# Patient Record
Sex: Male | Born: 1970 | Hispanic: No | Marital: Married | Smoking: Never smoker
Health system: Southern US, Community
[De-identification: ages and names within clinical notes are randomized; demographics above are authoritative.]

## PROBLEM LIST (undated history)

## (undated) ENCOUNTER — Emergency Department (HOSPITAL_BASED_OUTPATIENT_CLINIC_OR_DEPARTMENT_OTHER): Payer: BC Managed Care – PPO

## (undated) DIAGNOSIS — E785 Hyperlipidemia, unspecified: Secondary | ICD-10-CM

## (undated) DIAGNOSIS — Z8619 Personal history of other infectious and parasitic diseases: Secondary | ICD-10-CM

## (undated) HISTORY — DX: Hyperlipidemia, unspecified: E78.5

## (undated) HISTORY — DX: Personal history of other infectious and parasitic diseases: Z86.19

---

## 2004-12-31 ENCOUNTER — Ambulatory Visit: Payer: Self-pay | Admitting: Internal Medicine

## 2005-01-17 ENCOUNTER — Ambulatory Visit: Payer: Self-pay | Admitting: Internal Medicine

## 2005-05-20 ENCOUNTER — Ambulatory Visit: Payer: Self-pay | Admitting: Internal Medicine

## 2006-04-20 ENCOUNTER — Encounter: Payer: Self-pay | Admitting: Family Medicine

## 2006-04-20 ENCOUNTER — Ambulatory Visit: Payer: Self-pay | Admitting: Family Medicine

## 2006-04-20 DIAGNOSIS — E785 Hyperlipidemia, unspecified: Secondary | ICD-10-CM

## 2006-12-28 ENCOUNTER — Ambulatory Visit: Payer: Self-pay | Admitting: Family Medicine

## 2006-12-28 LAB — CONVERTED CEMR LAB
Albumin: 4.7 g/dL (ref 3.5–5.2)
Alkaline Phosphatase: 64 units/L (ref 39–117)
CO2: 27 meq/L (ref 19–32)
Calcium: 9.6 mg/dL (ref 8.4–10.5)
Chloride: 104 meq/L (ref 96–112)
Cholesterol: 195 mg/dL (ref 0–200)
Glucose, Bld: 88 mg/dL (ref 70–99)
LDL Cholesterol: 128 mg/dL — ABNORMAL HIGH (ref 0–99)
Potassium: 4.3 meq/L (ref 3.5–5.3)
Sodium: 139 meq/L (ref 135–145)
Total Protein: 7.3 g/dL (ref 6.0–8.3)
Triglycerides: 108 mg/dL (ref <150)

## 2006-12-29 ENCOUNTER — Encounter: Payer: Self-pay | Admitting: Family Medicine

## 2010-06-02 ENCOUNTER — Ambulatory Visit: Payer: Self-pay | Admitting: Family Medicine

## 2010-07-16 ENCOUNTER — Ambulatory Visit
Admission: RE | Admit: 2010-07-16 | Discharge: 2010-07-16 | Payer: Self-pay | Source: Home / Self Care | Attending: Family Medicine | Admitting: Family Medicine

## 2010-07-16 DIAGNOSIS — J029 Acute pharyngitis, unspecified: Secondary | ICD-10-CM | POA: Insufficient documentation

## 2010-07-17 ENCOUNTER — Encounter: Payer: Self-pay | Admitting: Family Medicine

## 2010-07-27 ENCOUNTER — Ambulatory Visit
Admission: RE | Admit: 2010-07-27 | Discharge: 2010-07-27 | Payer: Self-pay | Source: Home / Self Care | Attending: Family Medicine | Admitting: Family Medicine

## 2010-07-27 DIAGNOSIS — N10 Acute tubulo-interstitial nephritis: Secondary | ICD-10-CM | POA: Insufficient documentation

## 2010-07-27 LAB — CONVERTED CEMR LAB: Urobilinogen, UA: 0.2

## 2010-07-28 ENCOUNTER — Ambulatory Visit: Payer: Self-pay | Admitting: Family Medicine

## 2010-08-03 NOTE — Assessment & Plan Note (Signed)
Summary: FLU-SHOT  Nurse Visit   Allergies: No Known Drug Allergies  Immunizations Administered:  Influenza Vaccine # 1:    Vaccine Type: Fluvax 3+    Site: right deltoid    Mfr: GlaxoSmithKline    Dose: 0.5 ml    Route: IM    Given by: Sue Lush McCrimmon CMA, (AAMA)    Exp. Date: 12/03/2010    Lot #: [id number]    VIS given: 01/26/10 version given June 02, 2010.  Flu Vaccine Consent Questions:    Do you have a history of severe allergic reactions to this vaccine? no    Any prior history of allergic reactions to egg and/or gelatin? no    Do you have a sensitivity to the preservative Thimersol? no    Do you have a past history of Guillan-Barre Syndrome? no    Do you currently have an acute febrile illness? no    Have you ever had a severe reaction to latex? no    Vaccine information given and explained to patient? no  Orders Added: 1)  Flu Vaccine 37yrs + [90658] 2)  Admin 1st Vaccine [95638]

## 2010-08-04 ENCOUNTER — Ambulatory Visit (INDEPENDENT_AMBULATORY_CARE_PROVIDER_SITE_OTHER): Payer: BC Managed Care – PPO | Admitting: Family Medicine

## 2010-08-04 ENCOUNTER — Encounter: Payer: Self-pay | Admitting: Family Medicine

## 2010-08-04 DIAGNOSIS — N10 Acute tubulo-interstitial nephritis: Secondary | ICD-10-CM

## 2010-08-05 ENCOUNTER — Encounter: Payer: Self-pay | Admitting: Family Medicine

## 2010-08-05 NOTE — Assessment & Plan Note (Signed)
Summary: pyelonephritis   Vital Signs:  Patient profile:   40 year old [name] Height:      73 inches Weight:      186 pounds BMI:     24.63 O2 Sat:      98 % on Room air Temp:     99.6 degrees F oral Pulse rate:   91 / minute BP sitting:   127 / 79  (left arm) Cuff size:   regular  Vitals Entered By: Payton Spark CMA (July 27, 2010 8:59 AM)  O2 Flow:  Room air CC: ? Kidney stone x 3 days.    Primary Care Provider:  Seymour Bars DO  CC:  ? Kidney stone x 3 days. [name]  History of Present Illness: [name] presents for some abdominal pain that started 4 days ago.  On Sat, he woke up with bodyaches and chills.  That night, he had nightsweats.  On Sunday, he started having dysuria.  He has seen gross hematuria also.  He has had subjective fevers.  Has some nausea, no vomitting.  He is drinking a lot of water.  He has also had some flank pain and urgency.  He had a kidney stone at age 43 but none since then.  He denies any colicky pain.      Allergies: No Known Drug Allergies  Past History:  Past Medical History: Reviewed history from 04/20/2006 and no changes required. Hyperlipidemia  Social History: Reviewed history from 07/16/2010 and no changes required. 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   Eyes:  sclera non icteric Mouth:  pharynx pink and moist.  o/p mildly injected Neck:  no masses.   Lungs:  Normal respiratory effort, chest expands symmetrically. Lungs are clear to auscultation, no crackles or wheezes. Heart:  Normal rate and regular rhythm. S1 and S2 normal without gallop, murmur, click, rub or other extra sounds. Abdomen:  +suprapubic tenderness with R>L CVAT.  ND.  NABS.   Extremities:  no LE edema Skin:  diaphoretic Cervical Nodes:  No lymphadenopathy noted Psych:   good eye contact, not anxious appearing, and not depressed appearing.     Impression & Recommendations:  Problem # 1:  PYELONEPHRITIS, ACUTE (ICD-590.10)  UA grossly + for infection with symptoms c/w Pyelo.  Will treat with Bactrim DS x 7 days, Ibuprofen alternating with Tramadol for pain.  Hydrate with water and rest.  Call if fevers persist past 48 hrs, pain worsens or vomitting occurs.  Repeat UA with cx in 7-8 days.  Orders: UA Dipstick w/o Micro (automated)  (81003)  Complete Medication List: 1)  Nasonex 50 Mcg/act Susp (Mometasone furoate) .... 2 sprays/ nostril once daily 2)  Tramadol Hcl 50 Mg Tabs (Tramadol hcl) .Travis [name].. 1 tab by mouth q 8 hrs as needed pain 3)  Sulfamethoxazole-tmp Ds 800-160 Mg Tabs (Sulfamethoxazole-trimethoprim) .Travis [name].. 1 tab by mouth two times a day x 7 days  Patient Instructions: 1)  For kidney infection: 2)  Take Bactrim DS 1 tab with breakfast and dinner. 3)  Alternate Ibuprofen 800 mg three times a day with Tramadol up to 3 x a day for pain. 4)  Hydrate, rest. 5)  Call if any new symptoms develop. 6)  REturn for a repeat UA with culture in 7-8 days. Prescriptions: SULFAMETHOXAZOLE-TMP DS 800-160 MG  TABS (SULFAMETHOXAZOLE-TRIMETHOPRIM) 1 tab by mouth two times a day x 7 days  #14 x 0   Entered and Authorized by:   Seymour Bars DO   Signed by:   Seymour Bars DO on 07/27/2010   Method used:   Electronically to        [name] (retail)       [name]       [place], [place]  [address]       Ph: [phone]       Fax: [phone]   RxID:   [id number] TRAMADOL HCL 50 MG TABS (TRAMADOL HCL) 1 tab by mouth q 8 hrs as needed pain  #24 x 0   Entered and Authorized by:   Seymour Bars DO   Signed by:   Seymour Bars DO on 07/27/2010   Method used:   Electronically to        [name] (retail)       [name]       [place], [place]  [address]       Ph:  [phone]       Fax: [phone]   RxID:   [id number]    Orders Added: 1)  UA Dipstick w/o Micro (automated)  [81003] 2)  Est. Patient Level III [88416]     Laboratory Results   Urine Tests    Routine Urinalysis   Color: brown Appearance: Hazy Glucose: negative   (Normal Range: Negative) Bilirubin: small   (Normal Range: Negative) Ketone: moderate (40)   (Normal Range: Negative) Spec. Gravity: >=1.030   (Normal Range: 1.003-1.035) Blood: large   (Normal Range: Negative) pH: 6.5   (Normal Range: 5.0-8.0) Protein: >=300   (Normal Range: Negative) Urobilinogen: 0.2   (Normal Range: 0-1) Nitrite: positive   (Normal Range: Negative) Leukocyte Esterace: small   (Normal Range: Negative)

## 2010-08-05 NOTE — Assessment & Plan Note (Signed)
Summary: sore throat   Vital Signs:  Patient profile:   40 year old male Height:      73 inches Weight:      190 pounds BMI:     25.16 O2 Sat:      99 % on Room air Temp:     98.3 degrees F oral Pulse rate:   85 / minute BP sitting:   123 / 72  (left arm) Cuff size:   regular  Vitals Entered By: Payton Spark CMA (July 16, 2010 1:47 PM)  O2 Flow:  Room air CC: ST x 3 weeks.   Primary Care Provider:  Seymour Bars DO  CC:  ST x 3 weeks.Marland Kitchen  History of Present Illness: [name] presents for a sore throat that began before Christmas (about 3 wks ago).  He has not had much cough or congestion.  No fevers or chills.  Denies bodyaches or headaches.  Throat pain seems to be worse later in the days.  When the pain was bad, he was having odynophagia.  Denies heartburn symptoms.  Has some hoarsenss in the evening.    He is not a smoker or a drinker.  Has some postnasal dirp.  His kids have had strep in the past 2 wks.      Current Medications (verified): 1)  None  Allergies (verified): No Known Drug Allergies  Past History:  Past Medical History: Reviewed history from 04/20/2006 and no changes required. Hyperlipidemia  Social History: Reviewed history from 04/20/2006 and no changes required. 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   Head:  normocephalic and atraumatic.  sinuses NTTP Eyes:  conjunctiva clear; sclera non icteric Nose:  no nasal discharge.   Mouth:  good dentition and pharynx pink and moist.  o/p injected with cobblestoning. Neck:  no masses.   Lungs:  Normal respiratory effort, chest expands symmetrically. Lungs are clear to auscultation, no crackles or wheezes. Heart:  Normal rate and regular rhythm. S1 and S2 normal without gallop, murmur, click, rub or other extra  sounds. Abdomen:  no epigastric TTP Skin:  color normal.   Cervical Nodes:  No lymphadenopathy noted   Impression & Recommendations:  Problem # 1:  SORE THROAT (ICD-462) Rapid strep neg.  Sore throat x 3 wks.  Throat cx done.  F/U results. DDX includes: postnasal drip, acid reflux.  Will start him on Nasonex daily + supportive care measures.  If throat pain has not improved and his throat cx is negative, will send him to ENT to look for signs of reflux with directed laryngoscopy. Orders: Rapid Strep (70350) T-Culture, Throat ([id number])  Complete Medication List: 1)  Nasonex 50 Mcg/act Susp (Mometasone furoate) .... 2 sprays/ nostril once daily  Patient Instructions: 1)  Throat Culture today. 2)  Will call you w/ results by Monday. 3)  Rapid Strep: negative. 4)  Start Nasonex 2 sprays/ nostril daily for postnasal drip. 5)  Use Chlorasceptic spray with honey lemon tea. 6)  If throat culture is negative and your throat pain has not resolved after 10 days of Nasonex, please call and will get you in with ENT. Prescriptions: NASONEX 50 MCG/ACT SUSP (MOMETASONE FUROATE) 2 sprays/ nostril once daily  #1 bottle x 0   Entered and Authorized by:   Seymour Bars DO  Signed by:   Seymour Bars DO on 07/16/2010   Method used:   Electronically to        [facility] [address] (retail)       [address] [place], [place]  [address]       Ph: [phone]       Fax: [phone]   RxID:   [id number]    Orders Added: 1)  Rapid Strep [56387] 2)  T-Culture, Throat [[id number]] 3)  Est. Patient Level III [84166]    Laboratory Results    Other Tests  Rapid Strep: negative

## 2010-08-11 NOTE — Assessment & Plan Note (Signed)
Summary: f/u pyelo   Vital Signs:  Patient profile:   40 year old male Height:      73 inches Weight:      188 pounds Pulse rate:   78 / minute BP sitting:   122 / 78  (right arm) Cuff size:   regular  Vitals Entered By: Avon Gully CMA, Duncan Dull) (August 04, 2010 8:21 AM) CC: f/u pyelo   Primary Care Provider:  Seymour Bars DO  CC:  f/u pyelo.  History of Present Illness: [name] presents for f/u pylenephritis.  He just completed 7 days of Bactrim DS yesterday.  No longer having voiding complains or fevers or nightsweats.  He did have to take Motrin for the first 3 days of treatment.  His appetite and energy level are back to normal.    Allergies: No Known Drug Allergies  Past History:  Past Medical History: Reviewed history from 04/20/2006 and no changes required. Hyperlipidemia  Social History: Reviewed history from 07/16/2010 and no changes required. 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   Head:  normocephalic and atraumatic.   Mouth:  pharynx pink and moist.   Lungs:  Normal respiratory effort, chest expands symmetrically. Lungs are clear to auscultation, no crackles or wheezes. Heart:  Normal rate and regular rhythm. S1 and S2 normal without gallop, murmur, click, rub or other extra sounds. Abdomen:  no CVAT or suprapubic pressure Extremities:  no LE edema Skin:  color normal.   Psych:  good eye contact, not anxious appearing, and not depressed appearing.     Impression & Recommendations:  Problem # 1:  PYELONEPHRITIS, ACUTE (ICD-590.10) Assessment Improved Clinically, he is much improved and is UA looks to be much improved.  Will culture urine today to be sure infection has cleared (and there is no prostatitis).  F/U results in the next 48 hrs.    Orders: T-Culture, Urine ([id number])  Complete Medication List: 1)  Nasonex 50 Mcg/act Susp (Mometasone furoate) .... 2 sprays/ nostril once daily 2)  Sulfamethoxazole-tmp Ds 800-160 Mg Tabs (Sulfamethoxazole-trimethoprim) .Marland Kitchen.. 1 tab by mouth two times a day x 7 days  Patient Instructions: 1)  Will culture urine to be back either Fri or Monday. 2)  Call me if any further problems. 3)  Return this summer for PHYSICAL with fasting labs.   Orders Added: 1)  T-Culture, Urine [[id number]] 2)  Est. Patient Level III [30865]    Laboratory Results   Urine Tests  Date/Time Received: 08/04/10 Date/Time Reported: 08/04/10        Appended Document: f/u pyelo     Vitals Entered By: Payton Spark CMA (August 04, 2010 8:39 AM)  Allergies: No Known Drug Allergies   Complete Medication List: 1)  Nasonex 50 Mcg/act Susp (Mometasone furoate) .... 2 sprays/ nostril once daily 2)  Sulfamethoxazole-tmp Ds 800-160 Mg Tabs (Sulfamethoxazole-trimethoprim) .Marland Kitchen.. 1 tab by mouth two times a day x 7 days  Other Orders: UA Dipstick w/o Micro (automated)  (81003)   Orders Added: 1)  UA Dipstick w/o Micro (automated)  [81003]     Laboratory Results   Urine Tests    Routine Urinalysis   Color: yellow Appearance: Clear Glucose: negative   (Normal Range: Negative) Bilirubin: negative   (Normal Range: Negative) Ketone: negative   (Normal Range: Negative)  Spec. Gravity: 1.025   (Normal Range: 1.003-1.035) Blood: trace-intact   (Normal Range: Negative) pH: 6.0   (Normal Range: 5.0-8.0) Protein: negative   (Normal Range: Negative) Urobilinogen: 0.2   (Normal Range: 0-1) Nitrite: negative   (Normal Range: Negative) Leukocyte Esterace: trace   (Normal Range: Negative)

## 2010-11-04 ENCOUNTER — Ambulatory Visit (INDEPENDENT_AMBULATORY_CARE_PROVIDER_SITE_OTHER): Payer: BC Managed Care – PPO | Admitting: Family Medicine

## 2010-11-04 ENCOUNTER — Encounter: Payer: Self-pay | Admitting: Family Medicine

## 2010-11-04 DIAGNOSIS — Z0000 Encounter for general adult medical examination without abnormal findings: Secondary | ICD-10-CM

## 2010-11-04 DIAGNOSIS — Z130 Encounter for screening for diseases of the blood and blood-forming organs and certain disorders involving the immune mechanism: Secondary | ICD-10-CM

## 2010-11-04 DIAGNOSIS — R1012 Left upper quadrant pain: Secondary | ICD-10-CM

## 2010-11-04 DIAGNOSIS — Z13220 Encounter for screening for lipoid disorders: Secondary | ICD-10-CM

## 2010-11-04 DIAGNOSIS — I7389 Other specified peripheral vascular diseases: Secondary | ICD-10-CM

## 2010-11-04 LAB — POCT URINALYSIS DIPSTICK
Blood, UA: NEGATIVE
Leukocytes, UA: NEGATIVE
Nitrite, UA: NEGATIVE
Protein, UA: NEGATIVE
pH, UA: 7.5

## 2010-11-04 MED ORDER — TAMSULOSIN HCL 0.4 MG PO CAPS: 0.4000 mg | ORAL_CAPSULE | ORAL | Status: DC

## 2010-11-04 NOTE — Progress Notes (Signed)
  Subjective:    Patient ID: Travis [name], male    DOB: [birth year], 40 y.o.   MRN: [id number]  HPI  [name] presents for CPE.  He has had some urinary leakage after voiding x 4 wks.  He also has some abdominal pain in the LUQ with turning to the side.  It is a little twinge.  Started after he had a kidney infection last year.  He denies fam hx of premature heart dz, fam hx of colon or prostate cancer.  He has coldness and blueish tinge to his fingertips and toes and the tip of his nose for several mos.  Not change in Wt, GI symptoms, HAs, weakness or joint pains or swelling.  He is due for fsating labs.  Tetanus updated in 2007.    BP 120/83  Pulse 58  Ht 6\' 1"  (1.854 m)  Wt 195 lb (88.451 kg)  BMI 25.73 kg/m2  SpO2 100%      Review of Systems Gen: no fevers, chills, hot flashes, night sweats, change in weight GI: no N/V/C/D GU: no dysuria, incontinence or sexual dysfunction CV: no chest pain, DOE, palpitations s or edema Pulm:  Denies CP, SOB or chronic cough     Objective:   Physical Exam     Gen: alert, well groomed in NAD Neck: no thyromegaly or cervical lymphadenopathy CV: RRR w/o murmur, no audible carotid bruits or abdominal aortic bruits Ext: no edema, clubbing, acrocyanosis all fingertips and toes with > 2 sec cap RF.   Lungs: CTA bilat w/o W/R/R; nonlabored HEENT:  [place]/AT; PERRLA; oropharynx pink and moist with good dentition Abd: soft, + LUQ/ flank slightly tender to palpation, ND, NABS, No HSM, no audible AA bruits Skin: warm and dry; no rash, pallor or jaundice Psych: does not appear anxious or depressed; answers questions appropriately   Assessment & Plan:  Assesment:  1. CPE- Keeping healthy checklist for men reviewed today.  BP at goal.  BMI 25  in the  normal range.     Labs ordered Colonoscopy due at 48. Encouraged healthy diet, regular exercise, MVI daily. Return for next physical in 1 yr.   Immunizations UTD. Will f/u L flank pain/ acrocyanosis. UA  normal.  AUA score 6.  Trial of Flomax at bedtime.  RTC for f/u in 6 wks.

## 2010-11-04 NOTE — Patient Instructions (Addendum)
UA today. AUA symptom score today.  Labs today. Will call you w/ results and plan of care.  Start Flomax at bedtime.  This should help bladder to empty better if any prostate enlargement.  Return for f/u visit in 6 wks.

## 2010-11-05 ENCOUNTER — Telehealth: Payer: Self-pay | Admitting: Family Medicine

## 2010-11-05 DIAGNOSIS — I7389 Other specified peripheral vascular diseases: Secondary | ICD-10-CM

## 2010-11-05 DIAGNOSIS — R1012 Left upper quadrant pain: Secondary | ICD-10-CM

## 2010-11-05 LAB — SEDIMENTATION RATE: Sed Rate: 1 mm/hr (ref 0–16)

## 2010-11-05 LAB — CBC WITH DIFFERENTIAL/PLATELET
Eosinophils Relative: 0 % (ref 0–5)
Lymphocytes Relative: 36 % (ref 12–46)
Lymphs Abs: 1.2 10*3/uL (ref 0.7–4.0)
MCV: 96.8 fL (ref 78.0–100.0)
Neutrophils Relative %: 57 % (ref 43–77)
Platelets: 202 10*3/uL (ref 150–400)
RBC: 4.64 MIL/uL (ref 4.22–5.81)
WBC: 3.5 10*3/uL — ABNORMAL LOW (ref 4.0–10.5)

## 2010-11-05 LAB — ANA: Anti Nuclear Antibody(ANA): NEGATIVE

## 2010-11-05 LAB — COMPLETE METABOLIC PANEL WITH GFR
ALT: 43 U/L (ref 0–53)
Albumin: 5.1 g/dL (ref 3.5–5.2)
CO2: 27 mEq/L (ref 19–32)
Calcium: 9.5 mg/dL (ref 8.4–10.5)
Chloride: 102 mEq/L (ref 96–112)
GFR, Est African American: >60 mL/min (ref >60)
Potassium: 4.8 mEq/L (ref 3.5–5.3)
Sodium: 139 mEq/L (ref 135–145)
Total Protein: 7.7 g/dL (ref 6.0–8.3)

## 2010-11-05 LAB — LIPID PANEL: LDL Cholesterol: 184 mg/dL — ABNORMAL HIGH (ref 0–99)

## 2010-11-05 NOTE — Telephone Encounter (Signed)
LMOM for Pt to CB 

## 2010-11-05 NOTE — Telephone Encounter (Signed)
Pt aware of the above  

## 2010-11-05 NOTE — Telephone Encounter (Signed)
Pls let pt know that his blood counts came back normal other than a mildly low WBC count.  His fasting sugar is 103, into the prediabetic range with an elevated cholesterol  Of 265 and an LDL bad cholesterol of 184.  His inflammatory marker is normal.  ANA screen for lupus is neg.  I am going to proceed with a CT abdomen and pelvis to look at his kidneys.  I am going to get him into rheumatology to r/o Raynaud's Dz (blueish fingers and toes on exam).

## 2010-11-08 ENCOUNTER — Telehealth: Payer: Self-pay | Admitting: *Deleted

## 2010-11-08 NOTE — Telephone Encounter (Signed)
[facility] called stating Pt should only have CT w/ contrast per Dr. Allyson Sabal. If you agree please order and I will fax.

## 2010-11-09 NOTE — Telephone Encounter (Signed)
Yes, I agree 

## 2010-11-10 ENCOUNTER — Telehealth: Payer: Self-pay | Admitting: *Deleted

## 2010-11-10 DIAGNOSIS — R1031 Right lower quadrant pain: Secondary | ICD-10-CM

## 2010-11-10 NOTE — Telephone Encounter (Signed)
Pt aware of the above  

## 2010-11-10 NOTE — Telephone Encounter (Signed)
Pt states he is feeling some very minor SEs from flomax but doesn't feel that that it is helping w/ urinary problem. Pt would like to know if he should continue to take. Please advise.

## 2010-11-10 NOTE — Telephone Encounter (Signed)
Stop the Flomax.  I do need to f/u the results of his upcoming CT. Will go ahead and set him up with urology.

## 2010-11-16 ENCOUNTER — Ambulatory Visit
Admission: RE | Admit: 2010-11-16 | Discharge: 2010-11-16 | Disposition: A | Discharge status: Home / Self Care | Payer: BC Managed Care – PPO | Source: Ambulatory Visit | Attending: Family Medicine | Admitting: Family Medicine

## 2010-11-16 DIAGNOSIS — R1031 Right lower quadrant pain: Secondary | ICD-10-CM

## 2010-11-16 MED ORDER — IOHEXOL 300 MG/ML  SOLN
100.0000 mL | Freq: Once | INTRAMUSCULAR | Status: AC | PRN
  Administered 2010-11-16: 100 mL via INTRAVENOUS

## 2010-11-19 ENCOUNTER — Telehealth: Payer: Self-pay | Admitting: Family Medicine

## 2010-11-19 NOTE — Telephone Encounter (Signed)
Pt notified with his abdominal and kidney CT results.  He was told negative for this study. Jarvis Newcomer, LPN [facility]

## 2010-11-22 ENCOUNTER — Telehealth: Payer: Self-pay | Admitting: Family Medicine

## 2010-11-22 ENCOUNTER — Encounter: Payer: Self-pay | Admitting: Family Medicine

## 2010-11-22 ENCOUNTER — Ambulatory Visit (INDEPENDENT_AMBULATORY_CARE_PROVIDER_SITE_OTHER): Payer: BC Managed Care – PPO | Admitting: Family Medicine

## 2010-11-22 DIAGNOSIS — R32 Unspecified urinary incontinence: Secondary | ICD-10-CM | POA: Insufficient documentation

## 2010-11-22 DIAGNOSIS — M5431 Sciatica, right side: Secondary | ICD-10-CM | POA: Insufficient documentation

## 2010-11-22 DIAGNOSIS — M5430 Sciatica, unspecified side: Secondary | ICD-10-CM

## 2010-11-22 MED ORDER — METHYLPREDNISOLONE (PAK) 4 MG PO TABS: 4.0000 mg | ORAL_TABLET | Freq: Every day | ORAL | Status: DC

## 2010-11-22 MED ORDER — CYCLOBENZAPRINE HCL 10 MG PO TABS: 10.0000 mg | ORAL_TABLET | Freq: Every evening | ORAL | Status: DC | PRN

## 2010-11-22 NOTE — Assessment & Plan Note (Signed)
Very mild.  His CT was normal and he continues to have symptoms.  Tried flomax for BPH but this did not help.  Will proceed with urologyr eferal.

## 2010-11-22 NOTE — Progress Notes (Signed)
  Subjective:    Patient ID: [place] [name], male    DOB: 1971/06/18, 40 y.o.   MRN: [id number]  HPI  [name] presents for a Low back injury that occurred 1 wk ago.  He had arms outstretched tried to lift his 45 lb son above his head and he felt immediate pain in the lower back, shoots down the R leg.  Hurts the most getting out of a chair.  Feels better with standing.  Sleeping flat on back - ok.  Ibuprofen helps a little bit.  No hx of back problems.  Denies change to bowel or bladder habits (has had some leakage for months-- waiting to see urologist).  Just recently had a normal CT scan of his abdomen/ pelvis.  Has some tingling in to the R leg that does not extend past the knee.  No hx of back problems.  BP 125/88  Pulse 72  Resp 20  Ht 5\' 11"  (1.803 m)  Wt 193 lb (87.544 kg)  BMI 26.92 kg/m2  SpO2 100%     Review of Systems  Genitourinary: Negative for difficulty urinating.  Musculoskeletal: Positive for myalgias, back pain and gait problem.  Neurological: Positive for weakness. Negative for tremors.       Objective:   Physical Exam  Constitutional: He appears well-developed and well-nourished. No distress.  Cardiovascular: Normal rate, regular rhythm and normal heart sounds.   Pulmonary/Chest: Effort normal and breath sounds normal.  Musculoskeletal:       Lumbar back: He exhibits decreased range of motion (very limited iwth L spine flex/ ext), tenderness (tender L>R at L5-S1 and L sciatic notch) and spasm. He exhibits no edema.          Assessment & Plan:

## 2010-11-22 NOTE — Patient Instructions (Signed)
Take Medrol dose Pack in the morning (hold other anti-inflammatories like ibuprofen) while you are on this. Use Flexeril at night as muscle relaxer.   Avoid heavy lifting. Call if not resolved in 2 wks.     Lumbar Radiculopathy, Sciatica Sciatica is a weakness and/or changes in sensation (tingling, jolts, hot and cold, numbness) along the path the sciatic nerve travels. Irritation or damage to lumbar nerve roots is often also referred to as lumbar radiculopathy.  Lumbar radiculopathy (Sciatica) is the most common form of this problem. Radiculopathy can occur in any of the nerves coming out of the spinal cord. The problems caused depend on which nerves are involved. The sciatic nerve is the large nerve supplying the branches of nerves going from the hip to the toes. It often causes a numbness or weakness in the skin and/or muscles that the sciatic nerve serves. It also may cause symptoms (problems) of pain, burning, tingling, or electric shock-like feelings in the path of this nerve. This usually comes from injury to the fibers that make up the sciatic nerve. Some of these symptoms are low back pain and/or unpleasant feelings in the following areas:  From the mid-buttock down the back of the leg to the back of the knee.   And/or the outside of the calf and top of the foot.   And/or behind the inner ankle to the sole of the foot.  CAUSES  Herniated or slipped disc. Discs are the little cushions between the bones in the back.   Pressure by the piriformis muscle in the buttock on the sciatic nerve (Piriformis Syndrome).   Misalignment of the bones in the lower back and buttocks (Sacroiliac Joint Derangement).   Narrowing of the spinal canal that puts pressure on or pinches the fibers that make up the sciatic nerve.   A slipped vertebra that is out of line with those above or beneath it.   Abnormality of the nervous system itself so that nerve fibers do not transmit signals properly,  especially to feet and calves (neuropathy).   Tumor (this is rare).  Your caregiver can usually determine the cause of your sciatica and begin the treatment most likely to help you. TREATMENT Taking over-the-counter painkillers, physical therapy, rest, exercise, spinal manipulation, and injections of anesthetics and/or steroids may be used. Surgery, acupuncture, and Yoga can also be effective. Mind over matter techniques, mental imagery, and changing factors such as your bed, chair, desk height, posture, and activities are other treatments that may be helpful. You and your caregiver can help determine what is best for you. With proper diagnosis, the cause of most sciatica can be identified and removed. Communication and cooperation between your caregiver and you is essential. If you are not successful immediately, do not be discouraged. With time, a proper treatment can be found that will make you comfortable. HOME CARE INSTRUCTIONS  If the pain is coming from a problem in the back, applying ice to that area for 15 minutes, 2-3 times per day while awake, may be helpful. Put the ice in a plastic bag. Place a towel between the bag of ice and your skin.   You may exercise or perform your usual activities if these do not aggravate your pain, or as suggested by your caregiver.   Only take over-the-counter or prescription medicines for pain, discomfort, or fever as directed by your caregiver.   If your caregiver has given you a follow-up appointment, it is very important to keep that appointment. Not keeping  the appointment could result in a chronic or permanent injury, pain, and disability. If there is any problem keeping the appointment, you must call back to this facility for assistance.  SEEK IMMEDIATE MEDICAL CARE IF:  You experience loss of control of bowel or bladder.   You have increasing weakness in the trunk, buttocks, or legs.   There is numbness in any areas from the hip down to the toes.     You have difficulty walking or keeping your balance.   You have any of the above, with fever or forceful vomiting.  Document Released: 06/14/2001 Document Re-Released: 07/12/2009 [facility] Patient Information 2011 [place], [place].

## 2010-11-22 NOTE — Assessment & Plan Note (Signed)
Acute sciatica from lifting injury 1 wk ago with no hx of back problems.  Will trade his OTC ibuprofen for a medrol dose pack and use flexeril at night.  Heat/ ice and relative rest may also help.  Call if not improving in 10-14 days or if new symptoms develop.

## 2010-12-02 NOTE — Telephone Encounter (Signed)
Closed encounter. Johnnell Liou, LPN /Triage  

## 2010-12-05 ENCOUNTER — Encounter: Payer: Self-pay | Admitting: Family Medicine

## 2011-01-20 ENCOUNTER — Encounter: Payer: Self-pay | Admitting: Family Medicine

## 2011-01-20 ENCOUNTER — Ambulatory Visit (INDEPENDENT_AMBULATORY_CARE_PROVIDER_SITE_OTHER): Payer: BC Managed Care – PPO | Admitting: Family Medicine

## 2011-01-20 DIAGNOSIS — R32 Unspecified urinary incontinence: Secondary | ICD-10-CM

## 2011-01-20 DIAGNOSIS — R7309 Other abnormal glucose: Secondary | ICD-10-CM

## 2011-01-20 DIAGNOSIS — D72819 Decreased white blood cell count, unspecified: Secondary | ICD-10-CM

## 2011-01-20 DIAGNOSIS — M5431 Sciatica, right side: Secondary | ICD-10-CM

## 2011-01-20 DIAGNOSIS — E785 Hyperlipidemia, unspecified: Secondary | ICD-10-CM

## 2011-01-20 DIAGNOSIS — M5430 Sciatica, unspecified side: Secondary | ICD-10-CM

## 2011-01-20 NOTE — Patient Instructions (Signed)
Repeat labs, 8 hrs fasting one morning downstairs (M-F 8-5).  Will call you w/ results.    Work on healthy low sugar/ low carb diet. Can add Omega 3 Fish Oil 2-4 grams/ day.

## 2011-01-20 NOTE — Assessment & Plan Note (Signed)
Improved.  Seeing urology.  Did a trial of flomax but the SEs outweighed the benefit so he stopped.  He does have f/u with them.

## 2011-01-20 NOTE — Assessment & Plan Note (Signed)
Resolved

## 2011-01-20 NOTE — Progress Notes (Signed)
  Subjective:    Patient ID: Travis [name], male    DOB: Jul 22, 1970, 40 y.o.   MRN: [id number]  HPI [name] presents for f/u visit.  He did see urology and was tried on Flomax but thought it didn't work and the SEs were not worth it.  He is feeling great.  Back to regular exercise.  Back and sciatric leg pain have much improved.    I wanted to recheck his labs.  In may, his gluocse fasting was 103, high cholesterol was high and his WBC was a little low.    BP 115/79  Pulse 58  Ht 6\' 1"  (1.854 m)  Wt 191 lb (86.637 kg)  BMI 25.20 kg/m2    Review of Systems  Constitutional: Negative for fatigue and unexpected weight change.  Respiratory: Negative for shortness of breath.   Cardiovascular: Negative for chest pain and palpitations.  Genitourinary: Negative for dysuria and difficulty urinating.  Musculoskeletal: Negative for back pain.       Objective:   Physical Exam  Constitutional: He appears well-developed and well-nourished.  HENT:  Mouth/Throat: Oropharynx is clear and moist.  Neck: No thyromegaly present.  Cardiovascular: Normal rate and normal heart sounds.   No murmur heard. Pulmonary/Chest: Effort normal and breath sounds normal.  Musculoskeletal: He exhibits no edema.          Assessment & Plan:

## 2011-01-20 NOTE — Assessment & Plan Note (Signed)
Not on meds.  + fam hx of high chol.  Recheck FLP along iwt fasting sugar and WBC (low in may).  F/u results.

## 2011-06-21 ENCOUNTER — Encounter: Payer: Self-pay | Admitting: Family Medicine

## 2011-06-21 ENCOUNTER — Ambulatory Visit (INDEPENDENT_AMBULATORY_CARE_PROVIDER_SITE_OTHER): Payer: BC Managed Care – PPO | Admitting: Family Medicine

## 2011-06-21 ENCOUNTER — Ambulatory Visit: Payer: BC Managed Care – PPO | Admitting: Family Medicine

## 2011-06-21 VITALS — BP 105/68 | HR 63 | Temp 97.4°F | Ht 72.0 in | Wt 181.0 lb

## 2011-06-21 DIAGNOSIS — J209 Acute bronchitis, unspecified: Secondary | ICD-10-CM

## 2011-06-21 DIAGNOSIS — J019 Acute sinusitis, unspecified: Secondary | ICD-10-CM

## 2011-06-21 MED ORDER — FLUTICASONE PROPIONATE 50 MCG/ACT NA SUSP: 2.0000 | Freq: Every day | NASAL | Status: DC

## 2011-06-21 MED ORDER — FEXOFENADINE-PSEUDOEPHED ER 180-240 MG PO TB24: 1.0000 | ORAL_TABLET | Freq: Every day | ORAL | Status: DC

## 2011-06-21 MED ORDER — ALBUTEROL SULFATE HFA 108 (90 BASE) MCG/ACT IN AERS: 2.0000 | INHALATION_SPRAY | RESPIRATORY_TRACT | Status: DC | PRN

## 2011-06-21 MED ORDER — AMOXICILLIN-POT CLAVULANATE 875-125 MG PO TABS: 1.0000 | ORAL_TABLET | Freq: Two times a day (BID) | ORAL | Status: AC

## 2011-06-21 NOTE — Patient Instructions (Signed)

## 2011-06-21 NOTE — Progress Notes (Signed)
  Subjective:    Patient ID: Travis [name], male    DOB: 1970/12/24, 40 y.o.   MRN: [id number]  Sinusitis This is a new problem. The current episode started 1 to 4 weeks ago. The problem has been gradually worsening since onset. The maximum temperature recorded prior to his arrival was 100 - 100.9 F. Associated symptoms include chills, congestion, coughing, a hoarse voice, shortness of breath, sinus pressure, sneezing and a sore throat. Past treatments include oral decongestants. The treatment provided mild relief.      Review of Systems  Constitutional: Positive for chills.  HENT: Positive for congestion, sore throat, hoarse voice, sneezing and sinus pressure.   Respiratory: Positive for cough and shortness of breath.       BP 105/68  Pulse 63  Temp(Src) 97.4 F (36.3 C) (Oral)  Ht 6' (1.829 m)  Wt 181 lb (82.101 kg)  BMI 24.55 kg/m2  SpO2 98% Objective:   Physical Exam  Constitutional: He is oriented to person, place, and time. He appears well-developed and well-nourished.  HENT:  Head: Normocephalic.  Right Ear: External ear normal.  Left Ear: External ear normal.  Nose: Nose normal.  Mouth/Throat: Oropharynx is clear and moist.  Eyes: Pupils are equal, round, and reactive to light.  Neck: Normal range of motion. Neck supple.  Cardiovascular: Normal rate, regular rhythm and normal heart sounds.   Pulmonary/Chest: Effort normal. He has wheezes. He has rales.  Neurological: He is alert and oriented to person, place, and time.  Skin: Skin is warm and dry.          Assessment & Plan:  #1Sinusitis& bronchitis By history patient has sinusitis and bronchitis will will treat bronchitis with albuterol inhaler to use on when necessary basis for his sinusitis we'll place on Flonase nasal spray 2 puffs each nostril daily Allegra-D one tablet daily and Augmentin 875 one tablet twice a day return followup not better in 1-2 weeks #2Did note that there was a concern about his  cholesterol triglyceride he says that his nausea is a problem with his cholesterol triglycerides he may need to come back and recheck

## 2011-07-08 ENCOUNTER — Encounter: Payer: Self-pay | Admitting: Internal Medicine

## 2011-07-08 ENCOUNTER — Ambulatory Visit (HOSPITAL_BASED_OUTPATIENT_CLINIC_OR_DEPARTMENT_OTHER)
Admission: RE | Admit: 2011-07-08 | Discharge: 2011-07-08 | Disposition: A | Discharge status: Home / Self Care | Payer: BC Managed Care – PPO | Source: Ambulatory Visit | Attending: Internal Medicine | Admitting: Internal Medicine

## 2011-07-08 ENCOUNTER — Ambulatory Visit (INDEPENDENT_AMBULATORY_CARE_PROVIDER_SITE_OTHER): Payer: BC Managed Care – PPO | Admitting: Internal Medicine

## 2011-07-08 DIAGNOSIS — G629 Polyneuropathy, unspecified: Secondary | ICD-10-CM

## 2011-07-08 DIAGNOSIS — R209 Unspecified disturbances of skin sensation: Secondary | ICD-10-CM

## 2011-07-08 DIAGNOSIS — R634 Abnormal weight loss: Secondary | ICD-10-CM

## 2011-07-08 DIAGNOSIS — G589 Mononeuropathy, unspecified: Secondary | ICD-10-CM

## 2011-07-08 HISTORY — PX: NO PAST SURGERIES: SHX2092

## 2011-07-08 LAB — SEDIMENTATION RATE: Sed Rate: 4 mm/hr (ref 0–16)

## 2011-07-08 LAB — HEPATIC FUNCTION PANEL
Bilirubin, Direct: 0.1 mg/dL (ref 0.0–0.3)
Total Bilirubin: 0.6 mg/dL (ref 0.3–1.2)

## 2011-07-08 LAB — CBC WITH DIFFERENTIAL/PLATELET
Basophils Absolute: 0 10*3/uL (ref 0.0–0.1)
Basophils Relative: 0 % (ref 0–1)
Eosinophils Relative: 2 % (ref 0–5)
HCT: 44.6 % (ref 39.0–52.0)
MCHC: 33.6 g/dL (ref 30.0–36.0)
MCV: 96.5 fL (ref 78.0–100.0)
Monocytes Absolute: 0.4 10*3/uL (ref 0.1–1.0)
Neutro Abs: 1.7 10*3/uL (ref 1.7–7.7)
RDW: 13.1 % (ref 11.5–15.5)

## 2011-07-08 LAB — BASIC METABOLIC PANEL
BUN: 17 mg/dL (ref 6–23)
Calcium: 9.6 mg/dL (ref 8.4–10.5)
Creat: 1.03 mg/dL (ref 0.50–1.35)

## 2011-07-08 LAB — TSH: TSH: 3.219 u[IU]/mL (ref 0.350–4.500)

## 2011-07-08 LAB — VITAMIN B12: Vitamin B-12: 501 pg/mL (ref 211–911)

## 2011-07-08 LAB — T4, FREE: Free T4: 1.25 ng/dL (ref 0.80–1.80)

## 2011-07-08 NOTE — Progress Notes (Signed)
  Subjective:    Patient ID: Travis [name], male    DOB: December 19, 1970, 41 y.o.   MRN: [id number]  HPI Pt presents to clinic for evaluation of wt loss. Has 62month h/o unintended wt loss for ~17 lbs. Notes constant bilateral upper and lower ext numbness/tingling. S/p reported neg ncs. Notes head tingling with shaking of head. Notes subtle cloudiness of head for several months. Denies fever, sweats, abd pain, change in bowel habits or adenopathy. No other alleviating or exacerbating factors. No other complaints.   Past Medical History  Diagnosis Date  . Hyperlipidemia   . History of chicken pox     childhood   Past Surgical History  Procedure Date  . No past surgeries 07/08/2011    reports that he has never smoked. He has never used smokeless tobacco. He reports that he drinks about 1.5 ounces of alcohol per week. He reports that he does not use illicit drugs. family history includes Hyperlipidemia in his father and mother. No Known Allergies   Review of Systems  Constitutional: Positive for unexpected weight change. Negative for fever, chills, diaphoresis and appetite change.  Respiratory: Negative for cough and shortness of breath.   Cardiovascular: Negative for chest pain.  Gastrointestinal: Negative for nausea, abdominal pain, diarrhea, constipation and blood in stool.  Skin: Negative for rash.  Neurological: Positive for numbness. Negative for seizures, weakness and headaches.       Objective:   Physical Exam  Nursing note and vitals reviewed. Constitutional: He appears well-developed and well-nourished. No distress.  HENT:  Head: Normocephalic and atraumatic.  Right Ear: Tympanic membrane, external ear and ear canal normal.  Left Ear: Tympanic membrane, external ear and ear canal normal.  Nose: Nose normal.  Mouth/Throat: Oropharynx is clear and moist. No oropharyngeal exudate.  Eyes: Conjunctivae and EOM are normal. Pupils are equal, round, and reactive to light. No scleral  icterus.  Neck: Neck supple. No thyromegaly present.  Cardiovascular: Normal rate, regular rhythm and normal heart sounds.   Pulmonary/Chest: Effort normal and breath sounds normal. No respiratory distress. He has no wheezes. He has no rales.  Abdominal: Soft. Bowel sounds are normal. He exhibits no distension and no mass. There is no tenderness. There is no rebound and no guarding.  Lymphadenopathy:    He has no cervical adenopathy.  Neurological: He is alert. No cranial nerve deficit. Coordination normal.  Skin: Skin is warm and dry. He is not diaphoretic.  Psychiatric: He has a normal mood and affect.          Assessment & Plan:

## 2011-07-09 LAB — URINALYSIS, ROUTINE W REFLEX MICROSCOPIC
Hgb urine dipstick: NEGATIVE
Ketones, ur: NEGATIVE mg/dL
Nitrite: NEGATIVE
Urobilinogen, UA: 0.2 mg/dL (ref 0.0–1.0)
pH: 7 (ref 5.0–8.0)

## 2011-07-10 DIAGNOSIS — R634 Abnormal weight loss: Secondary | ICD-10-CM | POA: Insufficient documentation

## 2011-07-10 DIAGNOSIS — G629 Polyneuropathy, unspecified: Secondary | ICD-10-CM | POA: Insufficient documentation

## 2011-07-10 NOTE — Assessment & Plan Note (Signed)
Obtain cxr, cbc, chem7, lft, tsh, ft4, psa and esr. Schedule close follow up.

## 2011-07-10 NOTE — Assessment & Plan Note (Signed)
Obtain b12. Consider cranial mri vs neurology consult pending lab evaluation

## 2011-07-22 ENCOUNTER — Ambulatory Visit: Payer: BC Managed Care – PPO | Admitting: Internal Medicine

## 2011-07-27 ENCOUNTER — Ambulatory Visit (INDEPENDENT_AMBULATORY_CARE_PROVIDER_SITE_OTHER): Payer: BC Managed Care – PPO | Admitting: Internal Medicine

## 2011-07-27 ENCOUNTER — Encounter: Payer: Self-pay | Admitting: Internal Medicine

## 2011-07-27 VITALS — BP 100/68 | HR 62 | Temp 97.9°F | Resp 18 | Ht 72.0 in | Wt 183.0 lb

## 2011-07-27 DIAGNOSIS — G629 Polyneuropathy, unspecified: Secondary | ICD-10-CM

## 2011-07-27 DIAGNOSIS — G589 Mononeuropathy, unspecified: Secondary | ICD-10-CM

## 2011-07-27 DIAGNOSIS — R202 Paresthesia of skin: Secondary | ICD-10-CM

## 2011-07-27 DIAGNOSIS — R209 Unspecified disturbances of skin sensation: Secondary | ICD-10-CM

## 2011-07-28 ENCOUNTER — Encounter: Payer: Self-pay | Admitting: Neurology

## 2011-07-30 DIAGNOSIS — R202 Paresthesia of skin: Secondary | ICD-10-CM | POA: Insufficient documentation

## 2011-07-30 NOTE — Progress Notes (Signed)
  Subjective:    Patient ID: Travis [name], male    DOB: 1971/06/28, 41 y.o.   MRN: [id number]  HPI Pt presents to clinic for f/u of wt loss. Wt stable from last visit. Continues to note upper and lower extremity numbness and tingling as well as feeling of head feeling cloudy with intermittent slurred speech. Lab work and cxr unremarkable. Recalls having undergone reportedly unremarkable ncs but has not been evaluated by neurology. Reviewed unremarkable abd/pelvic ct 5/12. No other complaints.  Past Medical History  Diagnosis Date  . Hyperlipidemia   . History of chicken pox     childhood   Past Surgical History  Procedure Date  . No past surgeries 07/08/2011    reports that he has never smoked. He has never used smokeless tobacco. He reports that he drinks about 1.5 ounces of alcohol per week. He reports that he does not use illicit drugs. family history includes Hyperlipidemia in his father and mother. No Known Allergies   Review of Systems see hpi     Objective:   Physical Exam  Nursing note and vitals reviewed. Constitutional: He appears well-developed and well-nourished. No distress.  Neurological: He is alert.  Skin: He is not diaphoretic.  Psychiatric: He has a normal mood and affect.          Assessment & Plan:

## 2011-07-30 NOTE — Assessment & Plan Note (Signed)
With associated wt loss and associated neurologic sx's. Proceed with neurology consult

## 2011-08-01 ENCOUNTER — Other Ambulatory Visit: Payer: Self-pay | Admitting: Internal Medicine

## 2011-08-01 DIAGNOSIS — R4781 Slurred speech: Secondary | ICD-10-CM

## 2011-08-02 ENCOUNTER — Ambulatory Visit (HOSPITAL_BASED_OUTPATIENT_CLINIC_OR_DEPARTMENT_OTHER)
Admission: RE | Admit: 2011-08-02 | Discharge: 2011-08-02 | Disposition: A | Discharge status: Home / Self Care | Payer: BC Managed Care – PPO | Source: Ambulatory Visit | Attending: Internal Medicine | Admitting: Internal Medicine

## 2011-08-02 DIAGNOSIS — R4781 Slurred speech: Secondary | ICD-10-CM

## 2011-08-02 DIAGNOSIS — R4789 Other speech disturbances: Secondary | ICD-10-CM | POA: Insufficient documentation

## 2011-08-02 DIAGNOSIS — J3489 Other specified disorders of nose and nasal sinuses: Secondary | ICD-10-CM | POA: Insufficient documentation

## 2011-08-02 DIAGNOSIS — R209 Unspecified disturbances of skin sensation: Secondary | ICD-10-CM

## 2011-08-02 MED ORDER — GADOBENATE DIMEGLUMINE 529 MG/ML IV SOLN
20.0000 mL | Freq: Once | INTRAVENOUS | Status: AC | PRN
  Administered 2011-08-02: 20 mL via INTRAVENOUS

## 2011-09-05 ENCOUNTER — Encounter: Payer: Self-pay | Admitting: Neurology

## 2011-09-05 ENCOUNTER — Ambulatory Visit (INDEPENDENT_AMBULATORY_CARE_PROVIDER_SITE_OTHER): Payer: BC Managed Care – PPO | Admitting: Neurology

## 2011-09-05 DIAGNOSIS — G959 Disease of spinal cord, unspecified: Secondary | ICD-10-CM

## 2011-09-05 NOTE — Progress Notes (Signed)
Dear Dr. Rodena Medin,  Thank you for having me see Travis [name] in consultation today at [facility] for his problem with numbness and tingling in his extremities and in his upper gums and nose.  As you may recall, he is a 41 y.o. year old male with a history of hyperlipidemia who presents with a 9 month history of weight loss and progressive numbness and tingling of his extremities.  He cannot tell whether the numbness started in his hands or feet or whether it occurred simultaneously.  He thinks a few months later he started to develop numbness of the upper gums and of the nose.  Interestingly, if he moves his neck the it can change the sensation in his face.  He also has had 15 lbs of weight loss.  He endorses early satiety.  He also has developed urinary incontinence and urgency at times, but also post void dribbling.  Urologic evaluation has been unremarkable.  He thinks he has difficulty with erections as well.  He denies lightheadedness or constipation.  He does have some neck pain at times but it is minor.  He does say that he gets numbness around his genitals.  Past Medical History  Diagnosis Date  . Hyperlipidemia   . History of chicken pox     childhood    Past Surgical History  Procedure Date  . No past surgeries 07/08/2011    History   Social History  . Marital Status: Married    Spouse Name: N/A    Number of Children: N/A  . Years of Education: N/A   Social History Main Topics  . Smoking status: Never Smoker   . Smokeless tobacco: Never Used  . Alcohol Use: 1.5 oz/week    3 drink(s) per week     per week  . Drug Use: No  . Sexually Active: None     engineer, married, regular exercise, 1 son   Other Topics Concern  . None   Social History Narrative  . None    Family History  Problem Relation Age of Onset  . Hyperlipidemia Mother   . Hyperlipidemia Father     Current Outpatient Prescriptions on File Prior to Visit  Medication Sig Dispense Refill  .  Multiple Vitamin (MULTIVITAMIN) tablet Take 1 tablet by mouth daily.          No Known Allergies    ROS:  13 systems were reviewed and are notable for weight loss as above,  no fevers, chills, night sweats.  All other review of systems are unremarkable.   Examination:  Filed Vitals:   09/05/11 1540  BP: 114/72  Pulse: 64  Height: 6' (1.829 m)  Weight: 187 lb (84.823 kg)     In general, well appearing man.  Cardiovascular: The patient has a regular rate and rhythm and no carotid bruits.  Fundoscopy:  Disks are flat. Vessel caliber within normal limits.  Mental status:   The patient is oriented to person, place and time. Recent and remote memory are intact. Attention span and concentration are normal. Language including repetition, naming, following commands are intact. Fund of knowledge of current and historical events, as well as vocabulary are normal.  Cranial Nerves: Pupils are equally round and reactive to light. Visual fields full to confrontation. Extraocular movements are intact without nystagmus. Facial sensation and muscles of mastication are intact. Muscles of facial expression are symmetric. Hearing intact to bilateral finger rub. Tongue protrusion, uvula, palate midline.  Shoulder shrug intact  Motor:  The patient has normal bulk and tone, no pronator drift.  There are no adventitious movements.  5/5 muscle strength bilaterally.  Reflexes:  3+ throughout with - Hoffman's, - babinski's.  Toes down  Coordination:  Normal finger to nose.  No dysdiadokinesia.  Sensation is decrease to temperature in length dep pattern.  However, vibration is relatively intact.  Position intact.  I did not check perineal sensation or rectal tone.  Gait and Station are normal.  Tandem gait is intact.  Romberg is negative  MRI Brain was reviewed and was unremarkable including sagittal cuts to evaluated upper cervical cord.  EMG/NCS done by Dr. Stacy Gardner was reportedly  normal.  Impression/Recs:  Unusual syndrome of weight loss, difficulty with urination, and progressive numbness in hands and feet as well as face.  His reflexes suggest a cervical spine lesion - it is possible a high cervical spine lesion can give you facial numbness.  Certainly a myelopathy can give you difficulty with urination.  I am not sure how I could tie a myelopathy with the weight loss though.  However, an autonomic neuropathy could cause the weight loss.  I am going to start with a cervical spine MRI.  If this is normal I will need to get the EMG/NCS from [facility].  It is possible he has a sensory neuronopathy, but I would expect his SNAPs to be abnormal then.   We will see the patient back in 4 weeks.  Thank you for having [place] see Travis [name] in consultation.  Feel free to contact me with any questions.  Lupita Raider Modesto Charon, MD [facility], [address] [place], [place] [address] Phone: [phone] Fax: [phone].

## 2011-09-05 NOTE — Patient Instructions (Signed)
Your MRI is scheduled for Wednesday, March 6th at 8:00am.  Please arrive to [facility] MRI by 7:45am. [phone].

## 2011-09-06 ENCOUNTER — Encounter: Payer: Self-pay | Admitting: Internal Medicine

## 2011-09-06 ENCOUNTER — Ambulatory Visit (INDEPENDENT_AMBULATORY_CARE_PROVIDER_SITE_OTHER): Payer: BC Managed Care – PPO | Admitting: Internal Medicine

## 2011-09-06 DIAGNOSIS — R32 Unspecified urinary incontinence: Secondary | ICD-10-CM

## 2011-09-06 DIAGNOSIS — R202 Paresthesia of skin: Secondary | ICD-10-CM

## 2011-09-06 DIAGNOSIS — R209 Unspecified disturbances of skin sensation: Secondary | ICD-10-CM

## 2011-09-06 DIAGNOSIS — R634 Abnormal weight loss: Secondary | ICD-10-CM

## 2011-09-06 NOTE — Progress Notes (Signed)
  Subjective:    Patient ID: Travis [name], male    DOB: [birth year], 41 y.o.   MRN: [id number]  HPI Pt presents to clinic for followup of multiple medical problems. Wt loss has stabilized and has increased 2lbs since last visit. Eating unrestricted diet. Continues with paresthesias currently being evaluated by neurology. Proceeding with c spine mri. Has c/o intermittent urinary sx's including urgency and incontinence. States previously evaluated by urology. psa nl 1/13.   Past Medical History  Diagnosis Date  . Hyperlipidemia   . History of chicken pox     childhood   Past Surgical History  Procedure Date  . No past surgeries 07/08/2011    reports that he has never smoked. He has never used smokeless tobacco. He reports that he drinks about 1.5 ounces of alcohol per week. He reports that he does not use illicit drugs. family history includes Hyperlipidemia in his father and mother. No Known Allergies    Review of Systems see hpi     Objective:   Physical Exam  Nursing note and vitals reviewed. Constitutional: He appears well-developed and well-nourished. No distress.  Skin: He is not diaphoretic.          Assessment & Plan:

## 2011-09-07 ENCOUNTER — Telehealth: Payer: Self-pay | Admitting: Neurology

## 2011-09-07 ENCOUNTER — Ambulatory Visit (HOSPITAL_COMMUNITY)
Admission: RE | Admit: 2011-09-07 | Discharge: 2011-09-07 | Disposition: A | Discharge status: Home / Self Care | Payer: BC Managed Care – PPO | Source: Ambulatory Visit | Attending: Neurology | Admitting: Neurology

## 2011-09-07 DIAGNOSIS — G959 Disease of spinal cord, unspecified: Secondary | ICD-10-CM

## 2011-09-07 DIAGNOSIS — R209 Unspecified disturbances of skin sensation: Secondary | ICD-10-CM | POA: Insufficient documentation

## 2011-09-07 DIAGNOSIS — M5020 Other cervical disc displacement, unspecified cervical region: Secondary | ICD-10-CM | POA: Insufficient documentation

## 2011-09-07 DIAGNOSIS — J320 Chronic maxillary sinusitis: Secondary | ICD-10-CM | POA: Insufficient documentation

## 2011-09-07 MED ORDER — GADOBENATE DIMEGLUMINE 529 MG/ML IV SOLN
17.0000 mL | Freq: Once | INTRAVENOUS | Status: AC | PRN
  Administered 2011-09-07: 17 mL via INTRAVENOUS

## 2011-09-07 NOTE — Telephone Encounter (Signed)
Message copied by Benay Spice on Wed Sep 07, 2011  1:17 PM ------      Message from: Milas Gain      Created: Wed Sep 07, 2011 11:06 AM       Jan - You can let 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              Can you ask him the name of his rheumatologist and see if we can get the records from them  Also, we need to get the EMG/NCS from Dr. Stacy Gardner' office that was done.

## 2011-09-07 NOTE — Telephone Encounter (Signed)
Called and spoke with the patient. Information given re: MRI results as directed by Dr. Modesto Charon. I told him that Dr. Modesto Charon wanted medical records from two of his physicians (his rheumatologist and Dr. Anne Hahn @ [facility]) and that he would need to come by our office to sign a release form. The patient states he will. No other issues/concerns at this time.

## 2011-09-09 ENCOUNTER — Telehealth: Payer: Self-pay | Admitting: Neurology

## 2011-09-09 NOTE — Telephone Encounter (Signed)
Called and spoke with the patient and let him know the release of records form were ready for him to sign at his convenience. The patient states he will come by.

## 2011-09-10 NOTE — Assessment & Plan Note (Signed)
Stabilized. Work up unrevealing to date.

## 2011-09-10 NOTE — Assessment & Plan Note (Signed)
Neurology evaluation ongoing

## 2011-09-10 NOTE — Assessment & Plan Note (Signed)
Consider urology re-evaluation pending conclusion of neurology evaluation.

## 2011-10-03 ENCOUNTER — Ambulatory Visit (INDEPENDENT_AMBULATORY_CARE_PROVIDER_SITE_OTHER): Payer: BC Managed Care – PPO | Admitting: Neurology

## 2011-10-03 ENCOUNTER — Encounter: Payer: Self-pay | Admitting: Neurology

## 2011-10-03 VITALS — BP 116/70 | HR 72 | Ht 72.0 in | Wt 188.0 lb

## 2011-10-03 DIAGNOSIS — G629 Polyneuropathy, unspecified: Secondary | ICD-10-CM

## 2011-10-03 DIAGNOSIS — G589 Mononeuropathy, unspecified: Secondary | ICD-10-CM

## 2011-10-03 NOTE — Progress Notes (Signed)
Dear Dr. Rodena Medin,  I saw  Eugenio Hoes Winton back in [facility] clinic for his problem with bilateral numbness and tingling in his hands and feet as well as face accompanied by urgency, urinary incontinence and erectile dysfunction. When I first saw him I was worried that given his brisk reflexes, urinary incontinence and history that he could make his tingling change in his gums and nose when he manipulated his neck that he may have a high cervical lesion.  His MRI C-spine was unremarkable.  His NCS of his lower extremities done by Dr. Stacy Gardner 4 months ago was also unremarkable - although I have not been able to get a copy.  A previous MRI brain with and without contrast was also normal.  The patient also had significant weight loss, although has gained some of the 15lbs he lost back.  Otherwise he is doing about the same, with four extremity numbness and tingling, and urinary leakage at times.  He does not feel the numbness has progressed.   Medical history, social history, and family history were reviewed and have not changed since the last clinic visit.  Current Outpatient Prescriptions on File Prior to Visit  Medication Sig Dispense Refill  . Multiple Vitamin (MULTIVITAMIN) tablet Take 1 tablet by mouth daily.          No Known Allergies  ROS:  13 systems were reviewed and are notable for early satiety, but no orthostatic lightheadedness or constipation.  All other review of systems are unremarkable.  Exam: . Filed Vitals:   10/03/11 1434  BP: 116/70  Pulse: 72  Height: 6' (1.829 m)  Weight: 188 lb (85.276 kg)    In general, well appearing  Mental status:   The patient is oriented to person, place and time. Recent and remote memory are intact. Attention span and concentration are normal. Language including repetition, naming, following commands are intact. Fund of knowledge of current and historical events, as well as vocabulary are normal.  Cranial Nerves: Pupils  are equally round and reactive to light. Visual fields full to confrontation. Extraocular movements are intact without nystagmus. Facial sensation and muscles of mastication are intact. Muscles of facial expression are symmetric. Hearing intact to bilateral finger rub. Tongue protrusion, uvula, palate midline.  Shoulder shrug intact  Motor:  Normal bulk and tone, no drift and 5/5 muscle strength bilaterally.  Reflexes:  2+ thoughout, 1+ at ankles, toes down.  Coordination:  Normal finger to nose  Sensory testing:  Reduced to vibration in feet.  Temperature in a glove stocking pattern.  Position sense intact.  Perianal sensation normal.  ?reduced on right inner thigh.  Gait:  Normal gait and station.  Romberg negative.  Cremasteric reduced bilaterally.  Bulbocavernous bilaterally intact.  Impression/Recommendations:  1.  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  I recommend that the patient have a lumbar puncture, in particular to look for elevated protein to suggest a demyelinating neuropathy.  At the same time we would look for any other process that could be affecting his sacral nerves.  Also, I would suggest getting a lumbar spine MRI with and without contrast to look for a process that could be affecting his sacral nerves.  If these investigations were negative I would repeat his EMG/NCS and also try to arrange urodynamics and possibly autonomic testing.   He will call if he decides to proceed with the  LP and lumbar spine MRI.  Lupita Raider Modesto Charon, MD [facility], [place]

## 2011-10-05 NOTE — Progress Notes (Signed)
Got copy of NCS from 05/02/2011.  Confirmed normal.

## 2012-02-02 IMAGING — CR DG CHEST 2V
2 series · 2 of 2 positions shown · non-contrast
Comparison: None.

CLINICAL DATA: Weight loss of 20 pounds over 6 months

CHEST - 2 VIEW

[w chest pa]
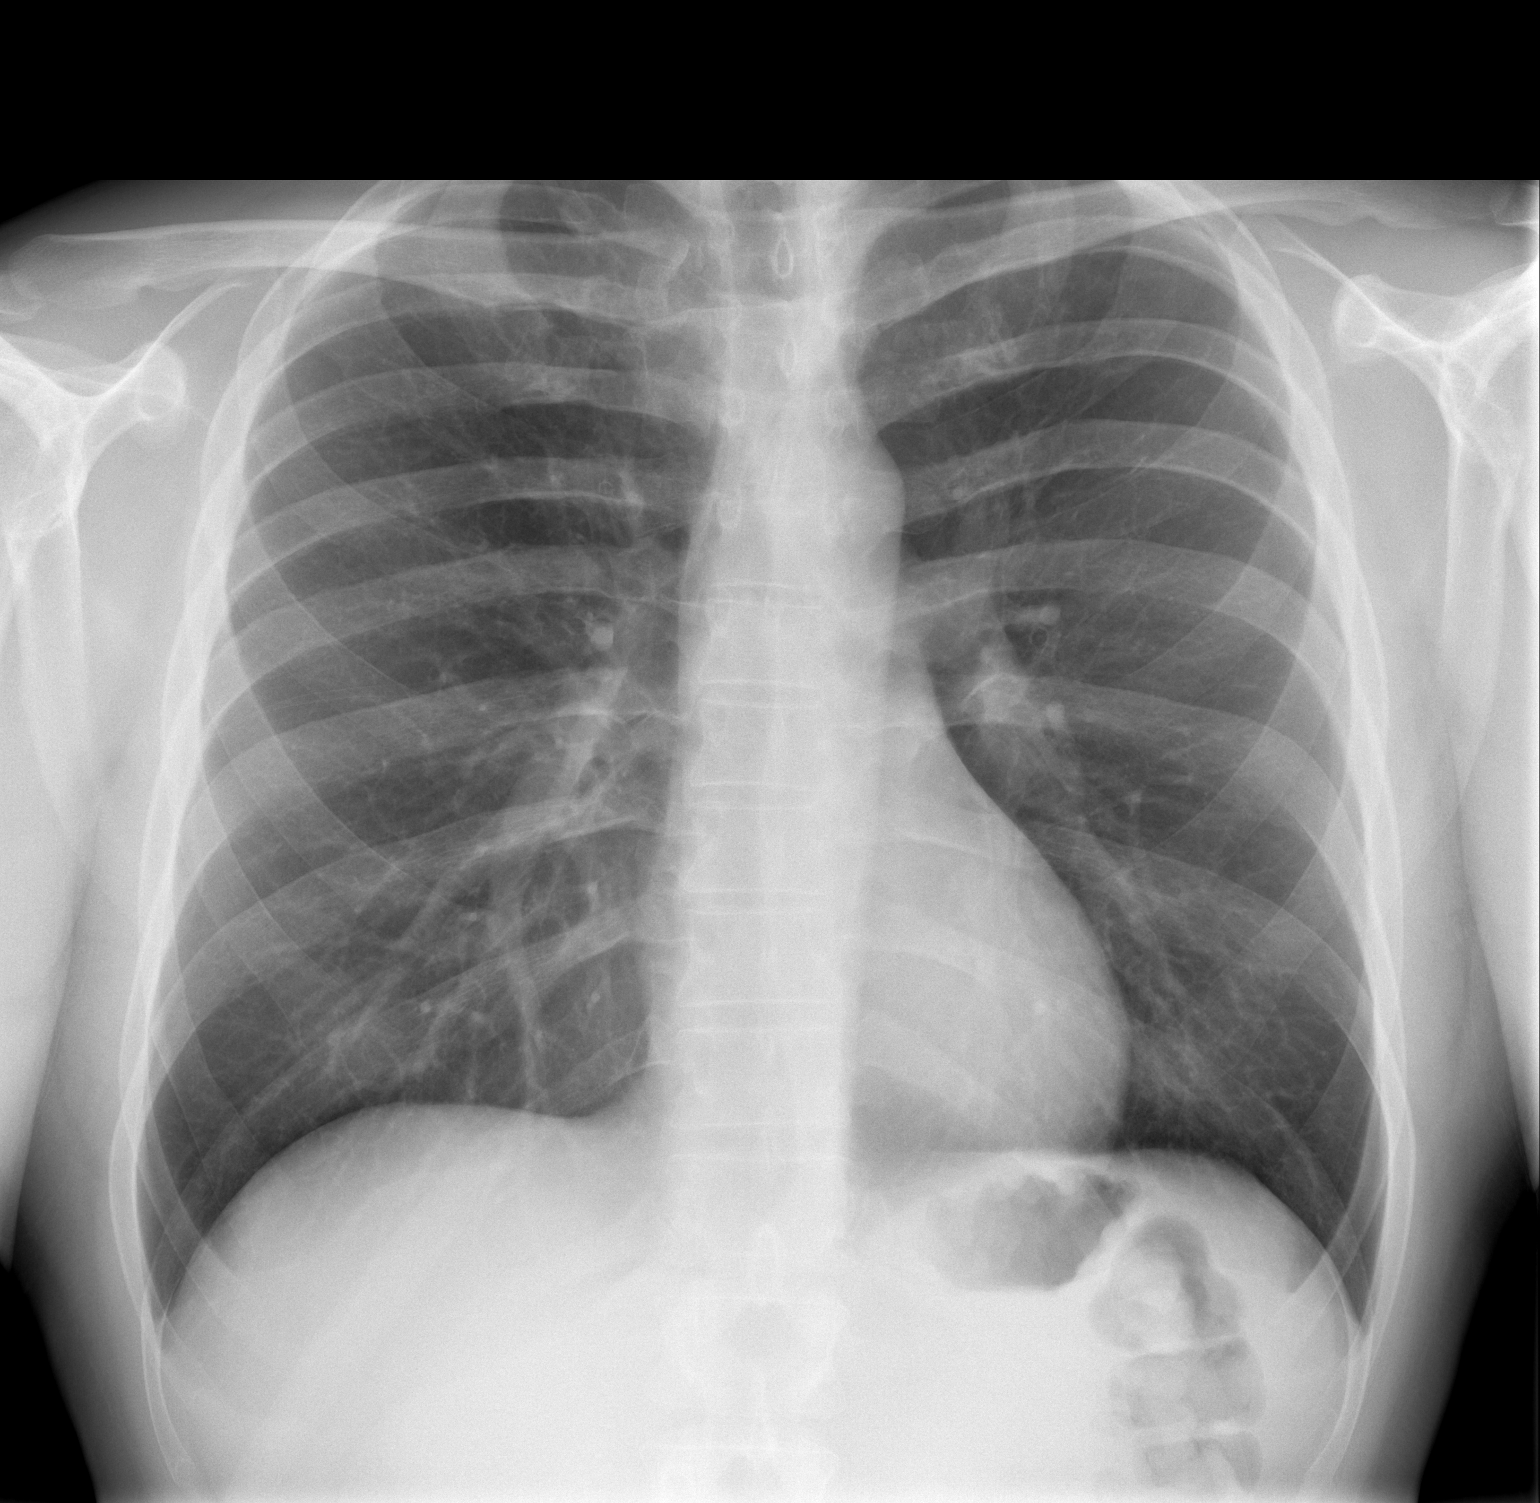

[w chest lat]
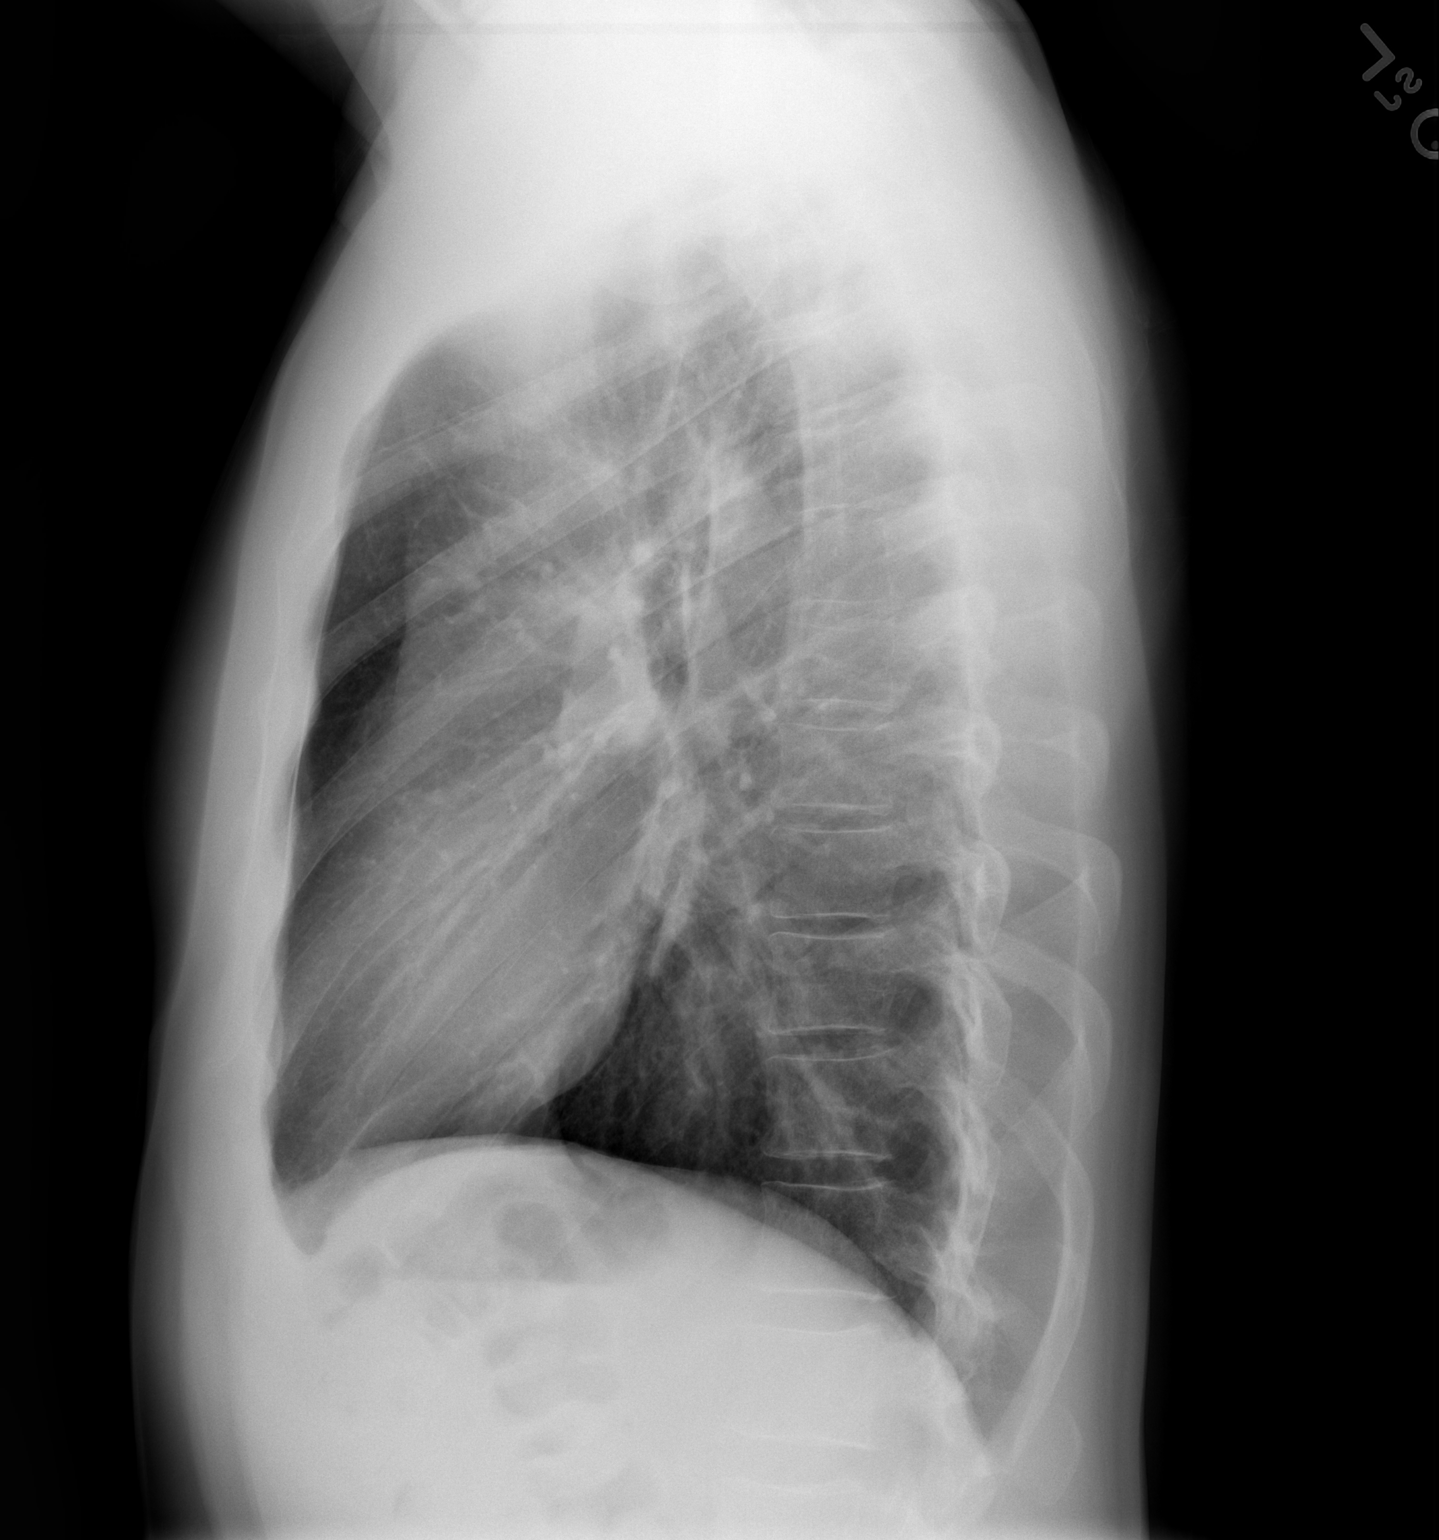

[2 of 2 positions shown; findings below may reference images not displayed]

FINDINGS: The lungs are clear.  Mediastinal contours appear normal.
The heart is within normal limits in size.  No bony abnormality is
seen.
IMPRESSION: No active lung disease.

## 2012-08-18 ENCOUNTER — Other Ambulatory Visit: Payer: Self-pay

## 2013-04-25 ENCOUNTER — Encounter: Payer: Self-pay | Admitting: Physician Assistant

## 2013-04-25 ENCOUNTER — Ambulatory Visit (INDEPENDENT_AMBULATORY_CARE_PROVIDER_SITE_OTHER): Payer: BC Managed Care – PPO | Admitting: Physician Assistant

## 2013-04-25 VITALS — BP 102/76 | HR 69 | Temp 97.7°F | Resp 16 | Ht 72.0 in | Wt 188.2 lb

## 2013-04-25 DIAGNOSIS — J019 Acute sinusitis, unspecified: Secondary | ICD-10-CM

## 2013-04-25 MED ORDER — AMOXICILLIN-POT CLAVULANATE 875-125 MG PO TABS: 1.0000 | ORAL_TABLET | Freq: Two times a day (BID) | ORAL | Status: DC

## 2013-04-25 NOTE — Patient Instructions (Signed)
Please take antibiotic as prescribed until all tablets are gone.  Take with food.  Increase fluid intake.  Get plenty of rest.  Saline nasal spray.  Daily probiotic.  Humidifier in bedroom. Please return to clinic if symptoms persist or worsen.  Please schedule an appointment for complete physical w/ fasting labs.

## 2013-04-25 NOTE — Progress Notes (Signed)
Patient ID: Travis [name], male   DOB: 09-08-1970, 42 y.o.   MRN: [id number]  Patient presents to clinic today c/o sinus pressure, sinus pain, post-nasal drip, rhinorrhea, dry cough x 2 weeks.  Patient endorses subjective fevers, jaw pain x 2 days.  Denies rash, N/V/D, recent travel, sick contact, or history of asthma/allergy.  Patient states he is otherwise healthy.    Patient is overdue for complete physical exam.   Past Medical History  Diagnosis Date  . Hyperlipidemia   . History of chicken pox     childhood    Current Outpatient Prescriptions on File Prior to Visit  Medication Sig Dispense Refill  . Multiple Vitamin (MULTIVITAMIN) tablet Take 1 tablet by mouth daily.         No current facility-administered medications on file prior to visit.    No Known Allergies  Family History  Problem Relation Age of Onset  . Hyperlipidemia Mother   . Hyperlipidemia Father     History   Social History  . Marital Status: Married    Spouse Name: N/A    Number of Children: N/A  . Years of Education: N/A   Social History Main Topics  . Smoking status: Never Smoker   . Smokeless tobacco: Never Used  . Alcohol Use: 1.5 oz/week    3 drink(s) per week     Comment: per week  . Drug Use: No  . Sexual Activity: None     Comment: engineer, married, regular exercise, 1 son   Other Topics Concern  . None   Social History Narrative  . None   ROS See HPI.  All other ROS are negative.  Filed Vitals:   04/25/13 0918  BP: 102/76  Pulse: 69  Temp: 97.7 F (36.5 C)  Resp: 16   Physical Exam  Vitals reviewed. Constitutional: He is oriented to person, place, and time and well-developed, well-nourished, and in no distress.  HENT:  Head: Normocephalic and atraumatic.  Right Ear: External ear normal.  Left Ear: External ear normal.  Nose: Nose normal.  Mouth/Throat: Oropharynx is clear and moist. No oropharyngeal exudate.  TM WNL bilaterally.  Tenderness to percussion of  bilateral frontal sinuses.  Eyes: Conjunctivae are normal.  Neck: Neck supple.  Cardiovascular: Normal rate, regular rhythm and normal heart sounds.   Pulmonary/Chest: Effort normal and breath sounds normal. No respiratory distress. He has no wheezes. He has no rales. He exhibits no tenderness.  Lymphadenopathy:    He has no cervical adenopathy.  Neurological: He is alert and oriented to person, place, and time.  Skin: Skin is warm and dry. No rash noted.   No results found for this or any previous visit (from the past 2160 hour(s)).  Assessment/Plan: Acute sinusitis with symptoms > 10 days Rx Augmentin.  Rest.  Fluids.  Saline nasal spray.  Humidifier.  Daily Claritin.

## 2013-04-25 NOTE — Assessment & Plan Note (Signed)
Rx Augmentin.  Rest.  Fluids.  Saline nasal spray.  Humidifier.  Daily Claritin.

## 2013-05-09 ENCOUNTER — Other Ambulatory Visit: Payer: Self-pay

## 2013-11-27 ENCOUNTER — Ambulatory Visit (INDEPENDENT_AMBULATORY_CARE_PROVIDER_SITE_OTHER): Payer: BC Managed Care – PPO | Admitting: Physician Assistant

## 2013-11-27 ENCOUNTER — Encounter: Payer: Self-pay | Admitting: Physician Assistant

## 2013-11-27 VITALS — BP 108/78 | HR 74 | Temp 98.8°F | Resp 16 | Ht 72.0 in | Wt 186.2 lb

## 2013-11-27 DIAGNOSIS — R32 Unspecified urinary incontinence: Secondary | ICD-10-CM

## 2013-11-27 DIAGNOSIS — Z0000 Encounter for general adult medical examination without abnormal findings: Secondary | ICD-10-CM

## 2013-11-27 DIAGNOSIS — R202 Paresthesia of skin: Secondary | ICD-10-CM

## 2013-11-27 DIAGNOSIS — N529 Male erectile dysfunction, unspecified: Secondary | ICD-10-CM | POA: Insufficient documentation

## 2013-11-27 DIAGNOSIS — R209 Unspecified disturbances of skin sensation: Secondary | ICD-10-CM

## 2013-11-27 LAB — CBC WITH DIFFERENTIAL/PLATELET
BASOS ABS: 0 10*3/uL (ref 0.0–0.1)
BASOS PCT: 0 % (ref 0–1)
EOS ABS: 0 10*3/uL (ref 0.0–0.7)
EOS PCT: 1 % (ref 0–5)
HCT: 44.8 % (ref 39.0–52.0)
Hemoglobin: 15.6 g/dL (ref 13.0–17.0)
Lymphocytes Relative: 30 % (ref 12–46)
Lymphs Abs: 1 10*3/uL (ref 0.7–4.0)
MCH: 32.6 pg (ref 26.0–34.0)
MCHC: 34.8 g/dL (ref 30.0–36.0)
MCV: 93.7 fL (ref 78.0–100.0)
Monocytes Absolute: 0.3 10*3/uL (ref 0.1–1.0)
Monocytes Relative: 8 % (ref 3–12)
Neutro Abs: 2.1 10*3/uL (ref 1.7–7.7)
Neutrophils Relative %: 61 % (ref 43–77)
PLATELETS: 203 10*3/uL (ref 150–400)
RBC: 4.78 MIL/uL (ref 4.22–5.81)
RDW: 13.3 % (ref 11.5–15.5)
WBC: 3.4 10*3/uL — ABNORMAL LOW (ref 4.0–10.5)

## 2013-11-27 LAB — HEMOGLOBIN A1C
HEMOGLOBIN A1C: 5.7 % — AB (ref <5.7)
MEAN PLASMA GLUCOSE: 117 mg/dL — AB (ref <117)

## 2013-11-27 LAB — SEDIMENTATION RATE: SED RATE: 1 mm/h (ref 0–16)

## 2013-11-27 MED ORDER — TADALAFIL 20 MG PO TABS: 10.0000 mg | ORAL_TABLET | Freq: Every day | ORAL | Status: DC | PRN

## 2013-11-27 NOTE — Assessment & Plan Note (Signed)
MRI lumbar spine. To r/o lesion or stenosis.  Will obtain UA and PSA. Exam within normal limits.  Will begin with Neurology consult giving other symptoms.  If unremarkable, will refer to Urology.

## 2013-11-27 NOTE — Assessment & Plan Note (Signed)
Will obtain MRI of lumbar spine per previous Neurology request.  Will repeat lab workup to include B12 and ESR. Referral to Neurology placed for further workup.

## 2013-11-27 NOTE — Progress Notes (Signed)
Patient presents to clinic today for annual exam.  Patient is fasting for labs.  Acute Concerns: Patient endorses continued paresthesias and change in urination.  Has been followed by Neurology but stopped seeing them about 2 years ago. Endorses numbness and tingling or hands and feet.  Patient without vitamin B12 deficiency or hx of diabetes.  Previous workup unremarkable.  Neurology had recommended MRI of lumbar spine and and spinal tap.  Patient requesting referral to Neurology to complete their recommendations. Patient denies saddle anesthesia or fecal retention/incontinence.  Occasional urge incontinence. Has never seen Urology.   Chronic Issues: Hx of hyperlipidemia -- will need repeat lipid panel.   Health Maintenance: Dental -- up-to-date Vision -- up-to-date Immunizations -- Tetanus up-to-date (2007)  Past Medical History  Diagnosis Date  . Hyperlipidemia   . History of chicken pox     childhood    Past Surgical History  Procedure Laterality Date  . No past surgeries  07/08/2011    Current Outpatient Prescriptions on File Prior to Visit  Medication Sig Dispense Refill  . Multiple Vitamin (MULTIVITAMIN) tablet Take 1 tablet by mouth daily.         No current facility-administered medications on file prior to visit.    No Known Allergies  Family History  Problem Relation Age of Onset  . Hyperlipidemia Mother   . Hyperlipidemia Father     History   Social History  . Marital Status: Married    Spouse Name: N/A    Number of Children: N/A  . Years of Education: N/A   Occupational History  . Not on file.   Social History Main Topics  . Smoking status: Never Smoker   . Smokeless tobacco: Never Used  . Alcohol Use: 1.5 oz/week    3 drink(s) per week     Comment: per week  . Drug Use: No  . Sexual Activity: Not on file     Comment: engineer, married, regular exercise, 1 son   Other Topics Concern  . Not on file   Social History Narrative  . No narrative  on file   Review of Systems  Constitutional: Negative for fever and weight loss.  HENT: Negative for ear pain, hearing loss and tinnitus.   Eyes: Negative for blurred vision, double vision and photophobia.  Respiratory: Negative for cough and sputum production.   Cardiovascular: Negative for chest pain and palpitations.  Gastrointestinal: Positive for constipation. Negative for heartburn, nausea, vomiting, abdominal pain, diarrhea, blood in stool and melena.  Genitourinary: Positive for urgency. Negative for dysuria, frequency, hematuria and flank pain.       Nocturia x 1.  + ED.  Musculoskeletal: Negative for back pain.  Neurological: Positive for tingling and sensory change. Negative for dizziness, tremors, speech change, focal weakness, seizures, loss of consciousness and headaches.  Endo/Heme/Allergies: Negative for environmental allergies.  Psychiatric/Behavioral: Negative for depression, suicidal ideas, hallucinations and substance abuse. The patient is not nervous/anxious and does not have insomnia.    BP 108/78  Pulse 74  Temp(Src) 98.8 F (37.1 C) (Oral)  Resp 16  Ht 6' (1.829 m)  Wt 186 lb 4 oz (84.482 kg)  BMI 25.25 kg/m2  SpO2 96%  Physical Exam  Vitals reviewed. Constitutional: He is oriented to person, place, and time and well-developed, well-nourished, and in no distress.  HENT:  Head: Normocephalic and atraumatic.  Right Ear: External ear normal.  Left Ear: External ear normal.  Nose: Nose normal.  Mouth/Throat: Oropharynx is clear and moist. No  oropharyngeal exudate.  TM within normal limits bilaterally.   Eyes: Conjunctivae and EOM are normal. Pupils are equal, round, and reactive to light.  Neck: Neck supple. No thyromegaly present.  Cardiovascular: Normal rate, regular rhythm, normal heart sounds and intact distal pulses.   Pulmonary/Chest: Effort normal and breath sounds normal. No respiratory distress. He has no wheezes. He has no rales. He exhibits no  tenderness.  Abdominal: Soft. Bowel sounds are normal. He exhibits no distension and no mass. There is no tenderness. There is no rebound and no guarding.  Lymphadenopathy:    He has no cervical adenopathy.  Neurological: He is alert and oriented to person, place, and time. He has normal sensation, normal strength and intact cranial nerves. Gait normal. GCS score is 15.  Reflex Scores:      Patellar reflexes are 3+ on the right side and 3+ on the left side.      Achilles reflexes are 3+ on the right side and 3+ on the left side. Skin: Skin is warm and dry. No rash noted.  Psychiatric: Affect normal.   Assessment/Plan: Paresthesia Will obtain MRI of lumbar spine per previous Neurology request.  Will repeat lab workup to include B12 and ESR. Referral to Neurology placed for further workup.   Visit for preventive health examination Medical history reviewed and updated.  Patient up-to-date on immunizations.  Will obtain fasting labs.   Urinary incontinence MRI lumbar spine. To r/o lesion or stenosis.  Will obtain UA and PSA. Exam within normal limits.  Will begin with Neurology consult giving other symptoms.  If unremarkable, will refer to Urology.   Erectile dysfunction Trial of cialis 10 mg.

## 2013-11-27 NOTE — Assessment & Plan Note (Signed)
Trial of cialis 10 mg.

## 2013-11-27 NOTE — Assessment & Plan Note (Signed)
Medical history reviewed and updated.  Patient up-to-date on immunizations.  Will obtain fasting labs.

## 2013-11-27 NOTE — Progress Notes (Signed)
Pre visit review using our clinic review tool, if applicable. No additional management support is needed unless otherwise documented below in the visit note/SLS  

## 2013-11-27 NOTE — Patient Instructions (Signed)
Please obtain labs. I will call you with your results.  Stop by the front desk to speak with Anderson Malta about scheduling MRI. You will be contacted by Neurology for additional workup of your chronic symptoms.  If symptoms acutely worsen, proceed to the ER.   Preventive Care for Adults, Male A healthy lifestyle and preventive care can promote health and wellness. Preventive health guidelines for men include the following key practices:  A routine yearly physical is a good way to check with your health care provider about your health and preventative screening. It is a chance to share any concerns and updates on your health and to receive a thorough exam.  Visit your dentist for a routine exam and preventative care every 6 months. Brush your teeth twice a day and floss once a day. Good oral hygiene prevents tooth decay and gum disease.  The frequency of eye exams is based on your age, health, family medical history, use of contact lenses, and other factors. Follow your health care provider's recommendations for frequency of eye exams.  Eat a healthy diet. Foods such as vegetables, fruits, whole grains, low-fat dairy products, and lean protein foods contain the nutrients you need without too many calories. Decrease your intake of foods high in solid fats, added sugars, and salt. Eat the right amount of calories for you.Get information about a proper diet from your health care provider, if necessary.  Regular physical exercise is one of the most important things you can do for your health. Most adults should get at least 150 minutes of moderate-intensity exercise (any activity that increases your heart rate and causes you to sweat) each week. In addition, most adults need muscle-strengthening exercises on 2 or more days a week.  Maintain a healthy weight. The body mass index (BMI) is a screening tool to identify possible weight problems. It provides an estimate of body fat based on height and weight. Your  health care provider can find your BMI and can help you achieve or maintain a healthy weight.For adults 20 years and older:  A BMI below 18.5 is considered underweight.  A BMI of 18.5 to 24.9 is normal.  A BMI of 25 to 29.9 is considered overweight.  A BMI of 30 and above is considered obese.  Maintain normal blood lipids and cholesterol levels by exercising and minimizing your intake of saturated fat. Eat a balanced diet with plenty of fruit and vegetables. Blood tests for lipids and cholesterol should begin at age 64 and be repeated every 5 years. If your lipid or cholesterol levels are high, you are over 50, or you are at high risk for heart disease, you may need your cholesterol levels checked more frequently.Ongoing high lipid and cholesterol levels should be treated with medicines if diet and exercise are not working.  If you smoke, find out from your health care provider how to quit. If you do not use tobacco, do not start.  Lung cancer screening is recommended for adults aged 53 80 years who are at high risk for developing lung cancer because of a history of smoking. A yearly low-dose CT scan of the lungs is recommended for people who have at least a 30-pack-year history of smoking and are a current smoker or have quit within the past 15 years. A pack year of smoking is smoking an average of 1 pack of cigarettes a day for 1 year (for example: 1 pack a day for 30 years or 2 packs a day for 15  years). Yearly screening should continue until the smoker has stopped smoking for at least 15 years. Yearly screening should be stopped for people who develop a health problem that would prevent them from having lung cancer treatment.  If you choose to drink alcohol, do not have more than 2 drinks per day. One drink is considered to be 12 ounces (355 mL) of beer, 5 ounces (148 mL) of wine, or 1.5 ounces (44 mL) of liquor.  Avoid use of street drugs. Do not share needles with anyone. Ask for help if  you need support or instructions about stopping the use of drugs.  High blood pressure causes heart disease and increases the risk of stroke. Your blood pressure should be checked at least every 1 2 years. Ongoing high blood pressure should be treated with medicines, if weight loss and exercise are not effective.  If you are 27 45 years old, ask your health care provider if you should take aspirin to prevent heart disease.  Diabetes screening involves taking a blood sample to check your fasting blood sugar level. This should be done once every 3 years, after age 57, if you are within normal weight and without risk factors for diabetes. Testing should be considered at a younger age or be carried out more frequently if you are overweight and have at least 1 risk factor for diabetes.  Colorectal cancer can be detected and often prevented. Most routine colorectal cancer screening begins at the age of 25 and continues through age 57. However, your health care provider may recommend screening at an earlier age if you have risk factors for colon cancer. On a yearly basis, your health care provider may provide home test kits to check for hidden blood in the stool. Use of a small camera at the end of a tube to directly examine the colon (sigmoidoscopy or colonoscopy) can detect the earliest forms of colorectal cancer. Talk to your health care provider about this at age 59, when routine screening begins. Direct exam of the colon should be repeated every 5 10 years through age 76, unless early forms of precancerous polyps or small growths are found.  People who are at an increased risk for hepatitis B should be screened for this virus. You are considered at high risk for hepatitis B if:  You were born in a country where hepatitis B occurs often. Talk with your health care provider about which countries are considered high-risk.  Your parents were born in a high-risk country and you have not received a shot to  protect against hepatitis B (hepatitis B vaccine).  You have HIV or AIDS.  You use needles to inject street drugs.  You live with, or have sex with, someone who has hepatitis B.  You are a man who has sex with other men (MSM).  You get hemodialysis treatment.  You take certain medicines for conditions such as cancer, organ transplantation, and autoimmune conditions.  Hepatitis C blood testing is recommended for all people born from [birth year] through 1965 and any individual with known risks for hepatitis C.  Practice safe sex. Use condoms and avoid high-risk sexual practices to reduce the spread of sexually transmitted infections (STIs). STIs include gonorrhea, chlamydia, syphilis, trichomonas, herpes, HPV, and human immunodeficiency virus (HIV). Herpes, HIV, and HPV are viral illnesses that have no cure. They can result in disability, cancer, and death.  A one-time screening for abdominal aortic aneurysm (AAA) and surgical repair of large AAAs by ultrasound are recommended for  men ages 33 to 28 years who are current or former smokers.  Healthy men should no longer receive prostate-specific antigen (PSA) blood tests as part of routine cancer screening. Talk with your health care provider about prostate cancer screening.  Testicular cancer screening is not recommended for adult males who have no symptoms. Screening includes self-exam, a health care provider exam, and other screening tests. Consult with your health care provider about any symptoms you have or any concerns you have about testicular cancer.  Use sunscreen. Apply sunscreen liberally and repeatedly throughout the day. You should seek shade when your shadow is shorter than you. Protect yourself by wearing long sleeves, pants, a wide-brimmed hat, and sunglasses year round, whenever you are outdoors.  Once a month, do a whole-body skin exam, using a mirror to look at the skin on your back. Tell your health care provider about new moles,  moles that have irregular borders, moles that are larger than a pencil eraser, or moles that have changed in shape or color.  Stay current with required vaccines (immunizations).  Influenza vaccine. All adults should be immunized every year.  Tetanus, diphtheria, and acellular pertussis (Td, Tdap) vaccine. An adult who has not previously received Tdap or who does not know his vaccine status should receive 1 dose of Tdap. This initial dose should be followed by tetanus and diphtheria toxoids (Td) booster doses every 10 years. Adults with an unknown or incomplete history of completing a 3-dose immunization series with Td-containing vaccines should begin or complete a primary immunization series including a Tdap dose. Adults should receive a Td booster every 10 years.  Varicella vaccine. An adult without evidence of immunity to varicella should receive 2 doses or a second dose if he has previously received 1 dose.  Human papillomavirus (HPV) vaccine. Males aged 92 21 years who have not received the vaccine previously should receive the 3-dose series. Males aged 91 26 years may be immunized. Immunization is recommended through the age of 59 years for any male who has sex with males and did not get any or all doses earlier. Immunization is recommended for any person with an immunocompromised condition through the age of 56 years if he did not get any or all doses earlier. During the 3-dose series, the second dose should be obtained 4 8 weeks after the first dose. The third dose should be obtained 24 weeks after the first dose and 16 weeks after the second dose.  Zoster vaccine. One dose is recommended for adults aged 54 years or older unless certain conditions are present.  Measles, mumps, and rubella (MMR) vaccine. Adults born before [birth year] generally are considered immune to measles and mumps. Adults born in [birth year] or later should have 1 or more doses of MMR vaccine unless there is a contraindication to the  vaccine or there is laboratory evidence of immunity to each of the three diseases. A routine second dose of MMR vaccine should be obtained at least 28 days after the first dose for students attending postsecondary schools, health care workers, or international travelers. People who received inactivated measles vaccine or an unknown type of measles vaccine during 1963 1967 should receive 2 doses of MMR vaccine. People who received inactivated mumps vaccine or an unknown type of mumps vaccine before 1979 and are at high risk for mumps infection should consider immunization with 2 doses of MMR vaccine. Unvaccinated health care workers born before 1957 who lack laboratory evidence of measles, mumps, or rubella immunity or laboratory  confirmation of disease should consider measles and mumps immunization with 2 doses of MMR vaccine or rubella immunization with 1 dose of MMR vaccine.  Pneumococcal 13-valent conjugate (PCV13) vaccine. When indicated, a person who is uncertain of his immunization history and has no record of immunization should receive the PCV13 vaccine. An adult aged 64 years or older who has certain medical conditions and has not been previously immunized should receive 1 dose of PCV13 vaccine. This PCV13 should be followed with a dose of pneumococcal polysaccharide (PPSV23) vaccine. The PPSV23 vaccine dose should be obtained at least 8 weeks after the dose of PCV13 vaccine. An adult aged 75 years or older who has certain medical conditions and previously received 1 or more doses of PPSV23 vaccine should receive 1 dose of PCV13. The PCV13 vaccine dose should be obtained 1 or more years after the last PPSV23 vaccine dose.  Pneumococcal polysaccharide (PPSV23) vaccine. When PCV13 is also indicated, PCV13 should be obtained first. All adults aged 42 years and older should be immunized. An adult younger than age 63 years who has certain medical conditions should be immunized. Any person who resides in a  nursing home or long-term care facility should be immunized. An adult smoker should be immunized. People with an immunocompromised condition and certain other conditions should receive both PCV13 and PPSV23 vaccines. People with human immunodeficiency virus (HIV) infection should be immunized as soon as possible after diagnosis. Immunization during chemotherapy or radiation therapy should be avoided. Routine use of PPSV23 vaccine is not recommended for American Indians, [place] Natives, or people younger than 65 years unless there are medical conditions that require PPSV23 vaccine. When indicated, people who have unknown immunization and have no record of immunization should receive PPSV23 vaccine. One-time revaccination 5 years after the first dose of PPSV23 is recommended for people aged 14 64 years who have chronic kidney failure, nephrotic syndrome, asplenia, or immunocompromised conditions. People who received 1 2 doses of PPSV23 before age 65 years should receive another dose of PPSV23 vaccine at age 47 years or later if at least 5 years have passed since the previous dose. Doses of PPSV23 are not needed for people immunized with PPSV23 at or after age 87 years.  Meningococcal vaccine. Adults with asplenia or persistent complement component deficiencies should receive 2 doses of quadrivalent meningococcal conjugate (MenACWY-D) vaccine. The doses should be obtained at least 2 months apart. Microbiologists working with certain meningococcal bacteria, [employer] recruits, people at risk during an outbreak, and people who travel to or live in countries with a high rate of meningitis should be immunized. A first-year college student up through age 33 years who is living in a residence hall should receive a dose if he did not receive a dose on or after his 16th birthday. Adults who have certain high-risk conditions should receive one or more doses of vaccine.  Hepatitis A vaccine. Adults who wish to be protected  from this disease, have certain high-risk conditions, work with hepatitis A-infected animals, work in hepatitis A research labs, or travel to or work in countries with a high rate of hepatitis A should be immunized. Adults who were previously unvaccinated and who anticipate close contact with an international adoptee during the first 60 days after arrival in the [place] from a country with a high rate of hepatitis A should be immunized.  Hepatitis B vaccine. Adults who wish to be protected from this disease, have certain high-risk conditions, may be exposed to blood or other  infectious body fluids, are household contacts or sex partners of hepatitis B positive people, are clients or workers in certain care facilities, or travel to or work in countries with a high rate of hepatitis B should be immunized.  Haemophilus influenzae type b (Hib) vaccine. A previously unvaccinated person with asplenia or sickle cell disease or having a scheduled splenectomy should receive 1 dose of Hib vaccine. Regardless of previous immunization, a recipient of a hematopoietic stem cell transplant should receive a 3-dose series 6 12 months after his successful transplant. Hib vaccine is not recommended for adults with HIV infection. Preventive Service / Frequency Ages 63 to 61  Blood pressure check.** / Every 1 to 2 years.  Lipid and cholesterol check.** / Every 5 years beginning at age 84.  Hepatitis C blood test.** / For any individual with known risks for hepatitis C.  Skin self-exam. / Monthly.  Influenza vaccine. / Every year.  Tetanus, diphtheria, and acellular pertussis (Tdap, Td) vaccine.** / Consult your health care provider. 1 dose of Td every 10 years.  Varicella vaccine.** / Consult your health care provider.  HPV vaccine. / 3 doses over 6 months, if 97 or younger.  Measles, mumps, rubella (MMR) vaccine.** / You need at least 1 dose of MMR if you were born in 1957 or later. You may also need a  second dose.  Pneumococcal 13-valent conjugate (PCV13) vaccine.** / Consult your health care provider.  Pneumococcal polysaccharide (PPSV23) vaccine.** / 1 to 2 doses if you smoke cigarettes or if you have certain conditions.  Meningococcal vaccine.** / 1 dose if you are age 36 to 32 years and a Market researcher living in a residence hall, or have one of several medical conditions. You may also need additional booster doses.  Hepatitis A vaccine.** / Consult your health care provider.  Hepatitis B vaccine.** / Consult your health care provider.  Haemophilus influenzae type b (Hib) vaccine.** / Consult your health care provider. Ages 75 to 11  Blood pressure check.** / Every 1 to 2 years.  Lipid and cholesterol check.** / Every 5 years beginning at age 17.  Lung cancer screening. / Every year if you are aged 85 80 years and have a 30-pack-year history of smoking and currently smoke or have quit within the past 15 years. Yearly screening is stopped once you have quit smoking for at least 15 years or develop a health problem that would prevent you from having lung cancer treatment.  Fecal occult blood test (FOBT) of stool. / Every year beginning at age 65 and continuing until age 54. You may not have to do this test if you get a colonoscopy every 10 years.  Flexible sigmoidoscopy** or colonoscopy.** / Every 5 years for a flexible sigmoidoscopy or every 10 years for a colonoscopy beginning at age 29 and continuing until age 69.  Hepatitis C blood test.** / For all people born from [birth year] through 1967 and any individual with known risks for hepatitis C.  Skin self-exam. / Monthly.  Influenza vaccine. / Every year.  Tetanus, diphtheria, and acellular pertussis (Tdap/Td) vaccine.** / Consult your health care provider. 1 dose of Td every 10 years.  Varicella vaccine.** / Consult your health care provider.  Zoster vaccine.** / 1 dose for adults aged 72 years or older.  Measles,  mumps, rubella (MMR) vaccine.** / You need at least 1 dose of MMR if you were born in 1959 or later. You may also need a second dose.  Pneumococcal 13-valent  conjugate (PCV13) vaccine.** / Consult your health care provider.  Pneumococcal polysaccharide (PPSV23) vaccine.** / 1 to 2 doses if you smoke cigarettes or if you have certain conditions.  Meningococcal vaccine.** / Consult your health care provider.  Hepatitis A vaccine.** / Consult your health care provider.  Hepatitis B vaccine.** / Consult your health care provider.  Haemophilus influenzae type b (Hib) vaccine.** / Consult your health care provider. Ages 102 and over  Blood pressure check.** / Every 1 to 2 years.  Lipid and cholesterol check.**/ Every 5 years beginning at age 2.  Lung cancer screening. / Every year if you are aged 7 80 years and have a 30-pack-year history of smoking and currently smoke or have quit within the past 15 years. Yearly screening is stopped once you have quit smoking for at least 15 years or develop a health problem that would prevent you from having lung cancer treatment.  Fecal occult blood test (FOBT) of stool. / Every year beginning at age 47 and continuing until age 4. You may not have to do this test if you get a colonoscopy every 10 years.  Flexible sigmoidoscopy** or colonoscopy.** / Every 5 years for a flexible sigmoidoscopy or every 10 years for a colonoscopy beginning at age 44 and continuing until age 26.  Hepatitis C blood test.** / For all people born from [birth year] through 1967 and any individual with known risks for hepatitis C.  Abdominal aortic aneurysm (AAA) screening.** / A one-time screening for ages 57 to 34 years who are current or former smokers.  Skin self-exam. / Monthly.  Influenza vaccine. / Every year.  Tetanus, diphtheria, and acellular pertussis (Tdap/Td) vaccine.** / 1 dose of Td every 10 years.  Varicella vaccine.** / Consult your health care provider.  Zoster  vaccine.** / 1 dose for adults aged 36 years or older.  Pneumococcal 13-valent conjugate (PCV13) vaccine.** / Consult your health care provider.  Pneumococcal polysaccharide (PPSV23) vaccine.** / 1 dose for all adults aged 10 years and older.  Meningococcal vaccine.** / Consult your health care provider.  Hepatitis A vaccine.** / Consult your health care provider.  Hepatitis B vaccine.** / Consult your health care provider.  Haemophilus influenzae type b (Hib) vaccine.** / Consult your health care provider. **Family history and personal history of risk and conditions may change your health care provider's recommendations. Document Released: 08/16/2001 Document Revised: 04/10/2013 Document Reviewed: 11/15/2010 [facility] Patient Information 2014 [place], [place].

## 2013-11-28 ENCOUNTER — Ambulatory Visit (INDEPENDENT_AMBULATORY_CARE_PROVIDER_SITE_OTHER): Payer: BC Managed Care – PPO | Admitting: Neurology

## 2013-11-28 ENCOUNTER — Encounter: Payer: Self-pay | Admitting: Neurology

## 2013-11-28 VITALS — BP 118/80 | HR 76 | Ht 73.0 in | Wt 189.0 lb

## 2013-11-28 DIAGNOSIS — R269 Unspecified abnormalities of gait and mobility: Secondary | ICD-10-CM

## 2013-11-28 DIAGNOSIS — G589 Mononeuropathy, unspecified: Secondary | ICD-10-CM

## 2013-11-28 DIAGNOSIS — R2681 Unsteadiness on feet: Secondary | ICD-10-CM

## 2013-11-28 DIAGNOSIS — G629 Polyneuropathy, unspecified: Secondary | ICD-10-CM

## 2013-11-28 DIAGNOSIS — R200 Anesthesia of skin: Secondary | ICD-10-CM

## 2013-11-28 DIAGNOSIS — R209 Unspecified disturbances of skin sensation: Secondary | ICD-10-CM

## 2013-11-28 LAB — BASIC METABOLIC PANEL
BUN: 17 mg/dL (ref 6–23)
CALCIUM: 9.6 mg/dL (ref 8.4–10.5)
CO2: 27 mEq/L (ref 19–32)
CREATININE: 1 mg/dL (ref 0.50–1.35)
Chloride: 102 mEq/L (ref 96–112)
Glucose, Bld: 97 mg/dL (ref 70–99)
Potassium: 4.9 mEq/L (ref 3.5–5.3)
Sodium: 137 mEq/L (ref 135–145)

## 2013-11-28 LAB — LIPID PANEL
CHOLESTEROL: 207 mg/dL — AB (ref 0–200)
HDL: 50 mg/dL (ref >39)
LDL CALC: 139 mg/dL — AB (ref 0–99)
TRIGLYCERIDES: 89 mg/dL (ref <150)
Total CHOL/HDL Ratio: 4.1 Ratio
VLDL: 18 mg/dL (ref 0–40)

## 2013-11-28 LAB — HEPATIC FUNCTION PANEL
ALT: 27 U/L (ref 0–53)
AST: 22 U/L (ref 0–37)
Albumin: 4.6 g/dL (ref 3.5–5.2)
Alkaline Phosphatase: 61 U/L (ref 39–117)
BILIRUBIN DIRECT: 0.2 mg/dL (ref 0.0–0.3)
BILIRUBIN INDIRECT: 0.8 mg/dL (ref 0.2–1.2)
TOTAL PROTEIN: 7.3 g/dL (ref 6.0–8.3)
Total Bilirubin: 1 mg/dL (ref 0.2–1.2)

## 2013-11-28 LAB — URINALYSIS, ROUTINE W REFLEX MICROSCOPIC
Bilirubin Urine: NEGATIVE
Glucose, UA: NEGATIVE mg/dL
Hgb urine dipstick: NEGATIVE
KETONES UR: NEGATIVE mg/dL
Leukocytes, UA: NEGATIVE
NITRITE: NEGATIVE
PH: 7.5 (ref 5.0–8.0)
Protein, ur: NEGATIVE mg/dL
SPECIFIC GRAVITY, URINE: 1.028 (ref 1.005–1.030)
Urobilinogen, UA: 1 mg/dL (ref 0.0–1.0)

## 2013-11-28 LAB — VITAMIN B12: VITAMIN B 12: 453 pg/mL (ref 211–911)

## 2013-11-28 LAB — TSH: TSH: 2.112 u[IU]/mL (ref 0.350–4.500)

## 2013-11-28 LAB — PSA: PSA: 0.33 ng/mL (ref <=4.00)

## 2013-11-28 NOTE — Patient Instructions (Signed)
1. Bloodwork for ANA, ANCA panel, SS-A, SS-B, celiac panel, CRP, SPEP/UPEP with IFE, copper 2. MRI thoracic and lumbar spine with and without contrast 3. EMG/NCV of upper and lower extremities

## 2013-11-28 NOTE — Progress Notes (Signed)
NEUROLOGY CONSULTATION NOTE  Travis [name] MRN: [id number] DOB: 10/01/1970  Referring provider: Leeanne Rio, PA-C Primary care provider: Leeanne Rio, PA-C  Reason for consult:  Numbness, balance problems  Thank you for your kind referral of Travis [name] for consultation of the above symptoms. Although his history is well known to you, please allow me to reiterate it for the purpose of our medical record. Records and images were personally reviewed where available.  HISTORY OF PRESENT ILLNESS: This is a pleasant 43 year old right-handed man with a history of diet-controlled hyperlipidemia previously evaluated by neurologist Dr. Jacelyn Grip for numbness and tingling in his hands and feet, that have progressed to affect his balance.  He was initially seen in March 2013 when he reported a 9 month history of weight loss and progressive numbness and tingling of his extremities. He initially noticed the numbness and tingling to affect the fingertips of both hands, then the toes of both feet, followed a few months later by numbness of the upper gums and of the nose. At that time, he reported a 15- lb weight loss, early satiety, and urinary incontinence and urgency at times, but also post void dribbling. Urologic evaluation reported to be unremarkable. He reported difficulty with erections as well.  At that time, he had an EMG/NCV reported as normal.  I personally reviewed MRI brain and cervical spine with and without contrast done in 2013, which were normal.  Since symptoms were not very bothersome for the patient at that time, he had decided to hold off on lumbar MRI and lumbar puncture discussed by Dr. Jacelyn Grip.  He presents today with continued constant paresthesias and numbness in his fingers and toes, now affecting his whole hand up to the wrists and both feet up to the ankles, slightly worse over the top of his feet with a slight burning/heat sensation.  At times, the numbness is worse over  the ulnar distribution on both hands.  He denies any pain.  The numbness in the upper teeth and tip of his nose continue.  His co-worker had first noticed the balance problems in December 2014, when he was staggering only after a few drinks.  Over the past month, he has noticed that when he walks down the hall at work, he has to put his shoulder on the wall to go a straight line.  He feels clumsy and has to catch himself, reporting this is mentally tiresome to continue to do.  He denies any falls.  He continues to notice urinary problems, with "a little leakage," unchanged since his last visit 2 years ago.  He is more constipated than usual.  He has noticed difficulties with erections as well, denies any perineal numbness.  He denies any weakness, no headaches, dizziness, diplopia, dysarthria, dysphagia, neck or back pain.  Around 3 years ago, he had back pain radiating down the right leg after lifting his son.  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  He notices these occur more when the tingling in his extremities are worse. There is no family history of similar symptoms.  Laboratory Data: Component     Latest Ref Rng 11/27/2013  WBC     4.0 - 10.5 K/uL 3.4 (L)  RBC     4.22 - 5.81 MIL/uL 4.78  Hemoglobin     13.0 - 17.0 g/dL 15.6  HCT     39.0 - 52.0 %  44.8  MCV     78.0 - 100.0 fL 93.7  MCH     26.0 - 34.0 pg 32.6  MCHC     30.0 - 36.0 g/dL 34.8  RDW     11.5 - 15.5 % 13.3  Platelets     150 - 400 K/uL 203  Neutrophils Relative %     43 - 77 % 61  NEUT#     1.7 - 7.7 K/uL 2.1  Lymphocytes Relative     12 - 46 % 30  Lymphocytes Absolute     0.7 - 4.0 K/uL 1.0  Monocytes Relative     3 - 12 % 8  Monocytes Absolute     0.1 - 1.0 K/uL 0.3  Eosinophils Relative     0 - 5 % 1  Eosinophils Absolute     0.0 - 0.7 K/uL 0.0  Basophils Relative     0 - 1 % 0  Basophils Absolute     0.0 - 0.1 K/uL  0.0  Smear Review      Criteria for review not met  Sodium     135 - 145 mEq/L 137  Potassium     3.5 - 5.3 mEq/L 4.9  Chloride     96 - 112 mEq/L 102  CO2     19 - 32 mEq/L 27  Glucose     70 - 99 mg/dL 97  BUN     6 - 23 mg/dL 17  Creatinine     0.50 - 1.35 mg/dL 1.00  Calcium     8.4 - 10.5 mg/dL 9.6  Total Bilirubin     0.2 - 1.2 mg/dL 1.0  Bilirubin, Direct     0.0 - 0.3 mg/dL 0.2  Indirect Bilirubin     0.2 - 1.2 mg/dL 0.8  Alkaline Phosphatase     39 - 117 U/L 61  AST     0 - 37 U/L 22  ALT     0 - 53 U/L 27  Total Protein     6.0 - 8.3 g/dL 7.3  Albumin     3.5 - 5.2 g/dL 4.6  Cholesterol     0 - 200 mg/dL 207 (H)  Triglycerides     <150 mg/dL 89  HDL     >39 mg/dL 50  Total CHOL/HDL Ratio      4.1  VLDL     0 - 40 mg/dL 18  LDL (calc)     0 - 99 mg/dL 139 (H)  Hemoglobin A1C     <5.7 % 5.7 (H)  Mean Plasma Glucose     <117 mg/dL 117 (H)  TSH     0.350 - 4.500 uIU/mL 2.112  Vitamin B-12     211 - 911 pg/mL 453  Sed Rate     0 - 16 mm/hr 1     PAST MEDICAL HISTORY: Past Medical History  Diagnosis Date  . Hyperlipidemia   . History of chicken pox     childhood    PAST SURGICAL HISTORY: Past Surgical History  Procedure Laterality Date  . No past surgeries  07/08/2011    MEDICATIONS: Current Outpatient Prescriptions on File Prior to Visit  Medication Sig Dispense Refill  . Multiple Vitamin (MULTIVITAMIN) tablet Take 1 tablet by mouth daily.         No current facility-administered medications on file prior to visit.    ALLERGIES: No Known Allergies  FAMILY HISTORY: Family History  Problem Relation Age of Onset  . Hyperlipidemia Mother   . Hyperlipidemia Father     SOCIAL HISTORY: History   Social History  . Marital Status: Married    Spouse Name: N/A    Number of Children: N/A  . Years of Education: N/A   Occupational History  . Not on file.   Social History Main Topics  . Smoking status: Never Smoker   .  Smokeless tobacco: Never Used  . Alcohol Use: 1.5 oz/week    3 drink(s) per week     Comment: per week  . Drug Use: No  . Sexual Activity: Not on file     Comment: engineer, married, regular exercise, 1 son   Other Topics Concern  . Not on file   Social History Narrative  . No narrative on file    REVIEW OF SYSTEMS: Constitutional: No fevers, chills, or sweats, no generalized fatigue, change in appetite Eyes: No visual changes, double vision, eye pain Ear, nose and throat: No hearing loss, ear pain, nasal congestion, sore throat Cardiovascular: No chest pain, palpitations Respiratory:  No shortness of breath at rest or with exertion, wheezes GastrointestinaI: No nausea, vomiting, diarrhea, abdominal pain, fecal incontinence Genitourinary:  No dysuria, urinary retention or frequency Musculoskeletal:  No neck pain, back pain Integumentary: No rash, pruritus, skin lesions Neurological: as above Psychiatric: No depression, insomnia, anxiety Endocrine: No palpitations, fatigue, diaphoresis, mood swings, change in appetite, change in weight, increased thirst Hematologic/Lymphatic:  No anemia, purpura, petechiae. Allergic/Immunologic: no itchy/runny eyes, nasal congestion, recent allergic reactions, rashes  PHYSICAL EXAM: Filed Vitals:   11/28/13 0756  BP: 118/80  Pulse: 76   General: No acute distress Head:  Normocephalic/atraumatic Eyes: Fundoscopic exam shows bilateral sharp discs, no vessel changes, exudates, or hemorrhages Neck: supple, no paraspinal tenderness, full range of motion Back: No paraspinal tenderness Heart: regular rate and rhythm Lungs: Clear to auscultation bilaterally. Vascular: No carotid bruits. Skin/Extremities: No rash, no edema Neurological Exam: Mental status: alert and oriented to person, place, and time, no dysarthria or aphasia, Fund of knowledge is appropriate.  Recent and remote memory are intact.  Attention and concentration are normal.    Able  to name objects and repeat phrases. Cranial nerves: CN I: not tested CN II: pupils equal, round and reactive to light, visual fields intact, fundi unremarkable. CN III, IV, VI:  full range of motion, no nystagmus, no ptosis CN V: facial sensation intact CN VII: upper and lower face symmetric CN VIII: hearing intact to finger rub CN IX, X: gag intact, uvula midline CN XI: sternocleidomastoid and trapezius muscles intact CN XII: tongue midline Bulk & Tone: normal, no atrophy or fasciculations. Motor: 5/5 throughout with no pronator drift. Sensation: decreased cold and pin in a glove and stocking pattern up to affecting the fingers and toes. Decreased pin and cold in the right outer calf and left inner calf.  Decreased vibration sense up to ankles bilaterally.  Intact joint position sense.  Romberg test slight sway Deep Tendon Reflexes: brisk +2 throughout, no ankle clonus, negative Hoffman's sign Plantar responses: downgoing bilaterally Cerebellar: no incoordination on finger to nose, heel to shin. No dysdiadochokinesia Gait: slightly wide-based, no ataxia, slight difficulty with tandem walk but able Tremor: none  IMPRESSION: This is a pleasant 43 year old right-handed man with no significant past medical history presenting for re-evaluation of a 2-year history of paresthesias and numbness in a glove and stocking pattern, now with balance issues.  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  He also reports some bladder and erectile dysfunction, concerning for autonomic involvement, however thoracic and lumbar MRI will be ordered to assess for structural abnormality, particularly affecting the sacral nerves.  Bloodwork for ANA, ANCA panel, SS-A, SS-B, celiac panel, CRP, SPEP/UPEP with IFE, copper will be ordered, as well as repeat EMG/NCV.  We have discussed holding off on lumbar puncture for now.  We discussed the option for symptomatic treatment with membrane stabilizing agents  such as gabapentin, however he would like to hold off on this for now as well.  He will follow-up after the tests.  Thank you for allowing me to participate in the care of this patient. Please do not hesitate to call for any questions or concerns.   Ellouise Newer, M.D.

## 2013-11-30 ENCOUNTER — Ambulatory Visit (HOSPITAL_BASED_OUTPATIENT_CLINIC_OR_DEPARTMENT_OTHER)
Admission: RE | Admit: 2013-11-30 | Discharge: 2013-11-30 | Disposition: A | Discharge status: Home / Self Care | Payer: BC Managed Care – PPO | Source: Ambulatory Visit | Attending: Physician Assistant | Admitting: Physician Assistant

## 2013-11-30 ENCOUNTER — Ambulatory Visit (HOSPITAL_BASED_OUTPATIENT_CLINIC_OR_DEPARTMENT_OTHER): Payer: BC Managed Care – PPO

## 2013-11-30 ENCOUNTER — Ambulatory Visit (HOSPITAL_BASED_OUTPATIENT_CLINIC_OR_DEPARTMENT_OTHER)
Admission: RE | Admit: 2013-11-30 | Discharge: 2013-11-30 | Disposition: A | Discharge status: Home / Self Care | Payer: BC Managed Care – PPO | Source: Ambulatory Visit | Attending: Neurology | Admitting: Neurology

## 2013-11-30 DIAGNOSIS — R2681 Unsteadiness on feet: Secondary | ICD-10-CM

## 2013-11-30 DIAGNOSIS — R209 Unspecified disturbances of skin sensation: Secondary | ICD-10-CM | POA: Insufficient documentation

## 2013-11-30 DIAGNOSIS — R32 Unspecified urinary incontinence: Secondary | ICD-10-CM | POA: Insufficient documentation

## 2013-11-30 DIAGNOSIS — R200 Anesthesia of skin: Secondary | ICD-10-CM

## 2013-11-30 DIAGNOSIS — G629 Polyneuropathy, unspecified: Secondary | ICD-10-CM

## 2013-11-30 MED ORDER — GADOBENATE DIMEGLUMINE 529 MG/ML IV SOLN: 15.0000 mL | Freq: Once | INTRAVENOUS | Status: DC | PRN

## 2013-12-02 ENCOUNTER — Telehealth: Payer: Self-pay | Admitting: Physician Assistant

## 2013-12-02 DIAGNOSIS — IMO0002 Reserved for concepts with insufficient information to code with codable children: Secondary | ICD-10-CM

## 2013-12-02 DIAGNOSIS — M5137 Other intervertebral disc degeneration, lumbosacral region: Secondary | ICD-10-CM

## 2013-12-02 LAB — C-REACTIVE PROTEIN

## 2013-12-02 NOTE — Telephone Encounter (Signed)
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  Some areas are worse than others.  I see his neurologist also ordered an MRI of his thoracic spine -- this also shows evidence of cord compression.  His neurologist will call him with this result as well.  I would like for him to continue seeing her and following her instructions, but I am also placing a referral for him to Neurosurgery.

## 2013-12-02 NOTE — Telephone Encounter (Signed)
LMOM [2nd] as return call with contact name and number for return call RE: results & provider instructions/SLS **Please see Results note as well**

## 2013-12-03 LAB — TISSUE TRANSGLUTAMINASE, IGA: Tissue Transglutaminase Ab, IgA: 3 U/mL (ref <20)

## 2013-12-03 LAB — GLIADIN ANTIBODIES, SERUM
GLIADIN IGA: 5 U/mL (ref <20)
GLIADIN IGG: 5.7 U/mL (ref <20)

## 2013-12-03 LAB — ANCA SCREEN W REFLEX TITER
ATYPICAL P-ANCA SCREEN: NEGATIVE
c-ANCA Screen: NEGATIVE
p-ANCA Screen: NEGATIVE

## 2013-12-03 LAB — RETICULIN ANTIBODIES, IGA W TITER: Reticulin Ab, IgA: NEGATIVE

## 2013-12-03 LAB — ANA: ANA: NEGATIVE

## 2013-12-03 LAB — COPPER, SERUM: Copper: 54 ug/dL — ABNORMAL LOW (ref 70–175)

## 2013-12-04 LAB — PROTEIN ELECTROPHORESIS, SERUM
ALPHA-1-GLOBULIN: 3.5 % (ref 2.9–4.9)
Albumin ELP: 63.2 % (ref 55.8–66.1)
Alpha-2-Globulin: 8.2 % (ref 7.1–11.8)
Beta 2: 4.4 % (ref 3.2–6.5)
Beta Globulin: 5.6 % (ref 4.7–7.2)
GAMMA GLOBULIN: 15.1 % (ref 11.1–18.8)
Total Protein, Serum Electrophoresis: 6.6 g/dL (ref 6.0–8.3)

## 2013-12-04 LAB — UIFE/LIGHT CHAINS/TP QN, 24-HR UR
Albumin, U: DETECTED
Alpha 1, Urine: DETECTED — AB
Alpha 2, Urine: DETECTED — AB
Beta, Urine: DETECTED — AB
FREE KAPPA LT CHAINS, UR: 1.32 mg/dL (ref 0.14–2.42)
FREE KAPPA/LAMBDA RATIO: 1.89 ratio — AB (ref 2.04–10.37)
FREE LAMBDA EXCRETION/DAY: 7.7 mg/d
FREE LAMBDA LT CHAINS, UR: 0.7 mg/dL — AB (ref 0.02–0.67)
FREE LT CHN EXCR RATE: 14.52 mg/d
Gamma Globulin, Urine: DETECTED — AB
TOTAL PROTEIN, URINE-UPE24: 2.6 mg/dL
Total Protein, Urine-Ur/day: 29 mg/d (ref 10–140)

## 2013-12-04 LAB — IMMUNOFIXATION ELECTROPHORESIS
IgA: 163 mg/dL (ref 68–379)
IgG (Immunoglobin G), Serum: 1150 mg/dL (ref 650–1600)
IgM, Serum: 79 mg/dL (ref 41–251)
Total Protein, Serum Electrophoresis: 6.6 g/dL (ref 6.0–8.3)

## 2013-12-04 LAB — CERULOPLASMIN: CERULOPLASMIN: 15 mg/dL — AB (ref 18–36)

## 2013-12-05 NOTE — Telephone Encounter (Signed)
Patient informed, understood & agreed; appt with specialist 06.15.15/SLS

## 2013-12-06 ENCOUNTER — Telehealth: Payer: Self-pay | Admitting: Neurology

## 2013-12-06 NOTE — Telephone Encounter (Signed)
Discussed MRI thoracic and lumbar with patient, there is mild cord flattening at several levels due to small disc protrusions on lower cervical and thoracic without abnormal cord signal. The lumbar MRI shows compression of the right lateral aspect of the thecal sac and contained nerve roots due to L2-3 right posterior lateral/foraminal/lateral protrusion, displacement of exiting right L2 nerve root, disc dessication at L5-S1  With bulge asymmetric greater to the right with contact with the upper S1 nerve root which does not appear compressed or displaced. Bloodwork for autoimmune disorders negative. Would proceed with EMG/NCV. He has also been referred to neurosurg and has an appt this month.

## 2013-12-11 ENCOUNTER — Ambulatory Visit (INDEPENDENT_AMBULATORY_CARE_PROVIDER_SITE_OTHER): Payer: BC Managed Care – PPO | Admitting: Neurology

## 2013-12-11 DIAGNOSIS — G629 Polyneuropathy, unspecified: Secondary | ICD-10-CM

## 2013-12-11 DIAGNOSIS — R2681 Unsteadiness on feet: Secondary | ICD-10-CM

## 2013-12-11 DIAGNOSIS — G589 Mononeuropathy, unspecified: Secondary | ICD-10-CM

## 2013-12-11 DIAGNOSIS — R200 Anesthesia of skin: Secondary | ICD-10-CM

## 2013-12-11 NOTE — Procedures (Signed)
[facility]  [address] [employer], [address]  [place], [place] [address] Tel: [phone] Fax:  [phone] Test Date:  12/11/2013  Patient: Travis [name] DOB: [birth year] Physician: Nita Sickle, DO  Sex: Male Height: 6\' 1"  Ref Phys: Patrcia Dolly  ID#: [id number] Temp: 35.0C Technician:    Patient Complaints: This is a 43 year-old gentleman presenting with bilateral hand and feet paresthesias and gait instability.  NCV & EMG Findings: Extensive electrodiagnostic testing of the right upper and lower extremity reveals:  1. Normal median, radial, sural and superficial peroneal sensory responses. The ulnar sensory response shows mild prolongation with preserved amplitude.  2. Normal median, ulnar, peroneal, and tibial motor responses.  3. Late responses including F wave (tibial 61.05 ms, ulnar 34.80ms) and H reflex studies (R 39.74ms L 39.33ms) are prolonged. 4. In the arms, there is no evidence of active or chronic motor axon loss changes affecting any of the tested muscles. 5. In the legs, chronic motor axon loss changes are seen affecting the L5-myotome without accompanied active denervation.  Impression: 1. Chronic right L5 radiculopathy, mild in degree electrically. 2. Mildly prolonged right ulnar sensory response is of unclear clinical significance.  Late responses are prolonged throughout and may be due to patient's height; alternatively, this can be seen in a demyelinating polyradiculoneuropathy, but is thought to be less likely in this patient. 3. In particular, there is no evidence of a generalized sensorimotor polyneuropathy affecting the right side.   ___________________________ Nita Sickle, DO    Nerve Conduction Studies Anti Sensory Summary Table   Site NR Peak (ms) Norm Peak (ms) P-T Amp (V) Norm P-T Amp  Right Median Anti Sensory (2nd Digit)  Wrist    3.3 <3.4 35.6 >20  Right Radial Anti Sensory (Base 1st Digit)  Wrist    2.5 <2.7 19.0 >18  Right Sup  Peroneal Anti Sensory (Ant Lat Mall)  12 cm    3.1 <4.5 17.0 >5  Right Sural Anti Sensory (Lat Mall)  Calf    3.6 <4.5 10.2 >5  Right Ulnar Anti Sensory (5th Digit)  Wrist    3.3 <3.1 37.5 >12   Motor Summary Table   Site NR Onset (ms) Norm Onset (ms) O-P Amp (mV) Norm O-P Amp Site1 Site2 Delta-0 (ms) Dist (cm) Vel (m/s) Norm Vel (m/s)  Right Median Motor (Abd Poll Brev)  Wrist    3.4 <3.9 17.3 >6 Elbow Wrist 5.4 29.5 55 >50  Elbow    8.8  16.0         Right Peroneal Motor (Ext Dig Brev)  Ankle    4.5 <5.5 4.2 >3 B Fib Ankle 8.5 36.0 42 >40  B Fib    13.0  3.9  Poplt B Fib 1.8 10.0 56 >40  Poplt    14.8  3.8         Right Tibial Motor (Abd Hall Brev)  Ankle    4.0 <6.0 9.8 >8 Knee Ankle 10.6 44.0 42 >40  Knee    14.6  7.6         Right Ulnar Motor (Abd Dig Minimi)  Wrist    2.7 <3.1 13.3 >7 B Elbow Wrist 4.5 26.0 58 >50  B Elbow    7.2  13.2  A Elbow B Elbow 1.9 10.0 53 >50  A Elbow    9.1  12.6          F Wave Studies   NR F-Lat (ms) Lat Norm (ms) L-R F-Lat (ms)  Right Tibial (Mrkrs) (Abd Hallucis)     61.05 <55   Right Ulnar (Mrkrs) (Abd Dig Min)     34.89 <33    H Reflex Studies   NR H-Lat (ms) Lat Norm (ms) L-R H-Lat (ms)  Left Tibial (Gastroc)     39.11 <35 0.00  Right Tibial (Gastroc)     39.11 <35 0.00   EMG   Side Muscle Ins Act Fibs Psw Fasc Number Recrt Dur Dur. Amp Amp. Poly Poly. Comment  Right AntTibialis Nml Nml Nml Nml 1- Mod-R Some 1+ Some 1+ Few 1+ N/A  Right GluteusMed Nml Nml Nml Nml 1- Mod-R Few 1+ Nml Nml Nml Nml N/A  Right Ext Indicis Nml Nml Nml Nml Nml Nml Nml Nml Nml Nml Nml Nml N/A  Right PronatorTeres Nml Nml Nml Nml Nml Nml Nml Nml Nml Nml Nml Nml N/A  Right 1stDorInt Nml Nml Nml Nml Nml Nml Nml Nml Nml Nml Nml Nml N/A  Right Triceps Nml Nml Nml Nml Nml Nml Nml Nml Nml Nml Nml Nml N/A  Right Deltoid Nml Nml Nml Nml Nml Nml Nml Nml Nml Nml Nml Nml N/A  Right ABD Dig Min Nml Nml Nml Nml Nml Nml Nml Nml Nml Nml Nml Nml N/A  Right Gastroc  Nml Nml Nml Nml Nml Nml Nml Nml Nml Nml Nml Nml N/A  Right RectFemoris Nml Nml Nml Nml Nml Nml Nml Nml Nml Nml Nml Nml N/A  Right FlexDigProf 4,5 Nml Nml Nml Nml Nml Nml Nml Nml Nml Nml Nml Nml N/A  Right Flex Dig Long Nml Nml Nml Nml 1- Rapid Some 1+ Some 1+ Few 1+ N/A      Waveforms:

## 2013-12-16 ENCOUNTER — Telehealth: Payer: Self-pay | Admitting: Neurology

## 2013-12-16 NOTE — Telephone Encounter (Signed)
Spoke to patient regarding unremarkable EMG results, would proceed with LP. Procedure discussed, post-LP headaches discussed, he was instructed to have someone drive him, lie flat after the procedure and drink fluids.  Patient expressed understanding.

## 2013-12-23 ENCOUNTER — Other Ambulatory Visit (HOSPITAL_BASED_OUTPATIENT_CLINIC_OR_DEPARTMENT_OTHER): Payer: Self-pay | Admitting: Neurological Surgery

## 2013-12-23 ENCOUNTER — Other Ambulatory Visit: Payer: Self-pay | Admitting: *Deleted

## 2013-12-23 DIAGNOSIS — R269 Unspecified abnormalities of gait and mobility: Secondary | ICD-10-CM

## 2013-12-23 DIAGNOSIS — R200 Anesthesia of skin: Secondary | ICD-10-CM

## 2013-12-27 ENCOUNTER — Ambulatory Visit
Admission: RE | Admit: 2013-12-27 | Discharge: 2013-12-27 | Disposition: A | Discharge status: Home / Self Care | Payer: BC Managed Care – PPO | Source: Ambulatory Visit | Attending: Neurology | Admitting: Neurology

## 2013-12-27 VITALS — BP 108/60 | HR 59

## 2013-12-27 DIAGNOSIS — R200 Anesthesia of skin: Secondary | ICD-10-CM

## 2013-12-27 DIAGNOSIS — G629 Polyneuropathy, unspecified: Secondary | ICD-10-CM

## 2013-12-27 DIAGNOSIS — R202 Paresthesia of skin: Secondary | ICD-10-CM

## 2013-12-27 LAB — CSF CELL COUNT WITH DIFFERENTIAL
RBC Count, CSF: 0 cu mm
Tube #: 4
WBC, CSF: 1 cu mm (ref 0–5)

## 2013-12-27 LAB — HIV ANTIBODY (ROUTINE TESTING W REFLEX): HIV: NONREACTIVE

## 2013-12-27 LAB — RPR

## 2013-12-27 LAB — GLUCOSE, CSF: Glucose, CSF: 63 mg/dL (ref 43–76)

## 2013-12-27 LAB — PROTEIN, CSF: TOTAL PROTEIN, CSF: 53 mg/dL — AB (ref 15–45)

## 2013-12-27 NOTE — Discharge Instructions (Signed)

## 2013-12-27 NOTE — Progress Notes (Signed)
Blood drawn from left AC to go with spinal fluid. Site is unremarkable and pt tolerated procedure well. Discharge instructions explained to pt. 

## 2013-12-27 NOTE — Progress Notes (Signed)
More blood drawn from left Travis [name] due to additional test ordered.  Site is unremarkable.

## 2013-12-28 ENCOUNTER — Ambulatory Visit (HOSPITAL_BASED_OUTPATIENT_CLINIC_OR_DEPARTMENT_OTHER)
Admission: RE | Admit: 2013-12-28 | Discharge: 2013-12-28 | Disposition: A | Discharge status: Home / Self Care | Payer: BC Managed Care – PPO | Source: Ambulatory Visit | Attending: Neurological Surgery | Admitting: Neurological Surgery

## 2013-12-28 DIAGNOSIS — M5020 Other cervical disc displacement, unspecified cervical region: Secondary | ICD-10-CM | POA: Insufficient documentation

## 2013-12-28 DIAGNOSIS — R269 Unspecified abnormalities of gait and mobility: Secondary | ICD-10-CM

## 2013-12-31 ENCOUNTER — Telehealth: Payer: Self-pay | Admitting: Neurology

## 2013-12-31 ENCOUNTER — Other Ambulatory Visit: Payer: Self-pay | Admitting: *Deleted

## 2013-12-31 ENCOUNTER — Ambulatory Visit
Admission: RE | Admit: 2013-12-31 | Discharge: 2013-12-31 | Disposition: A | Discharge status: Home / Self Care | Payer: BC Managed Care – PPO | Source: Ambulatory Visit | Attending: Neurology | Admitting: Neurology

## 2013-12-31 DIAGNOSIS — R200 Anesthesia of skin: Secondary | ICD-10-CM

## 2013-12-31 MED ORDER — IOHEXOL 180 MG/ML  SOLN
1.0000 mL | Freq: Once | INTRAMUSCULAR | Status: AC | PRN
  Administered 2013-12-31: 1 mL via EPIDURAL

## 2013-12-31 NOTE — Telephone Encounter (Signed)
Patient had a LP on Friday and now is having a headache all he can do is lay around it does bother him as long as he is laying down please advise

## 2013-12-31 NOTE — Progress Notes (Signed)
[facility] Dr. Alfredo BattyMattern in to speak to pt.  Site is unremarkable and pt tolerated blood draw well. Discharge instructions explained to pt.

## 2013-12-31 NOTE — Telephone Encounter (Signed)
Patient scheduled.

## 2013-12-31 NOTE — Discharge Instructions (Signed)

## 2013-12-31 NOTE — Telephone Encounter (Signed)
I spoke to patient, HAs started yesterday, had to go home from work today. He lay down and HAs immediately disappeared, c/w post-LP HAs. Discussed treating with caffeine/fioricet and observation, then blood patch if still symptomatic, however he is going on vacation tomorrow. He will be scheduled for blood patch today.  Erica, pls schedule for blood patch today, thanks!

## 2013-12-31 NOTE — Telephone Encounter (Signed)
Pt has requested to speak to Dr. Karel JarvisAquino regarding his headaches.  C/b [phone]

## 2014-01-07 ENCOUNTER — Telehealth: Payer: Self-pay | Admitting: Neurology

## 2014-01-07 DIAGNOSIS — E610 Copper deficiency: Secondary | ICD-10-CM

## 2014-01-07 NOTE — Telephone Encounter (Signed)
Left VM on home and cell for updates on how he is doing after blood patch, also to discuss LP results and low copper level

## 2014-01-08 MED ORDER — COPPER GLUCONATE 2 MG PO CAPS: ORAL_CAPSULE | ORAL | Status: DC

## 2014-01-08 NOTE — Telephone Encounter (Signed)
Discussed low copper levels with patient, would start copper supplements: take 8mg  daily for 1 week, then 6mg  daily for 1 week, then 4mg  daily for 1 week, then 2mg  and continue.  Discussed mildly elevated CSF protein of unclear clinical significance, would like for our neuromuscular specialist Dr. Eliane DecreePatel's opinion. He agreed and will follow-up with her.

## 2014-01-08 NOTE — Telephone Encounter (Signed)
Left VM on cell for patient to call back regarding best time and number to call him.

## 2014-01-08 NOTE — Telephone Encounter (Signed)
Pt called/returning your call at 1:23PM. C/B [phone]

## 2014-02-07 ENCOUNTER — Ambulatory Visit (INDEPENDENT_AMBULATORY_CARE_PROVIDER_SITE_OTHER): Payer: BC Managed Care – PPO | Admitting: Neurology

## 2014-02-07 ENCOUNTER — Encounter: Payer: Self-pay | Admitting: Neurology

## 2014-02-07 VITALS — BP 104/78 | HR 77 | Ht 73.23 in | Wt 192.3 lb

## 2014-02-07 DIAGNOSIS — E610 Copper deficiency: Secondary | ICD-10-CM

## 2014-02-07 DIAGNOSIS — E538 Deficiency of other specified B group vitamins: Secondary | ICD-10-CM

## 2014-02-07 DIAGNOSIS — G320 Subacute combined degeneration of spinal cord in diseases classified elsewhere: Secondary | ICD-10-CM

## 2014-02-07 NOTE — Patient Instructions (Signed)
1.  Check blood work 2.  Continue copper 2mg  daily 3.  Return to clinic in 563-months

## 2014-02-07 NOTE — Progress Notes (Signed)
[facility] Clinic Note - Initial Visit   Date: 02/10/2014  Elvan Ebron Marcy MRN: [id number] DOB: 04/21/1971   Dear Dr. Delice Lesch:  Thank you for your kind referral of Hosam Mcfetridge Angelini for consultation of paresthesias of his hands and feet. Although his history is well known to you, please allow [place] to reiterate it for the purpose of our medical record. The patient was accompanied to the clinic by self.    History of Present Illness: Gurjit Loconte Pote is a 43 y.o. right-handed Caucasian male with history of diet-controlled hyperlidemia presenting for evaluation of paresthesias of the hands and feet.    Starting around 2012, he developed gradual onset of tingling of the tips of his fingers which progressed to involve his toes several months later. He later starting having intermittent numbness over the upper palate and tip and bladder incontinence, which is no longer present.  He was evaluated by Dr. Jacelyn Grip in March 2013 at which time NCS/EMG of the lower extremities performed by Dr. Chales Salmon was normal.  Imaging of the neuraxis showed C5-6 disc protrusion and mild cord flattening and L2-3 protrusion with compression of the L2 nerve root.  CSF testing was recommended, but patient deferred due to only have mild symptoms.   Starting Spring 2015, he noticed that when he was walking down a hallway, he needs to focus more on maintaining a straight line. He also started having numbness involving the entire hand, especially over the medial aspect. He saw Dr. Delice Lesch in May 2015 for these symptoms and had repeat EMG which showed chronic L5 radiculopathy and mild prolonged ulnar and late responses. He has CSF testing which showed normal cell count, protein, and inflammatory markers.  Serology testing showed low levels of copper and ceruloplasmin, so he was started on copper supplementation.  He feels that his balance is somewhat improved since mid-July, but numbness/tingling is still  present.  He has no weakness and has not had any falls.  No dry eyes, dry mouth, bowel/bladder problems.   He lost 10lb unitentnionally over the past 2 years, so increased his intake of food and weiht has since stabilized.   Past Medical History  Diagnosis Date  . Hyperlipidemia   . History of chicken pox     childhood    Past Surgical History  Procedure Laterality Date  . No past surgeries  07/08/2011     Medications:  Current Outpatient Prescriptions on File Prior to Visit  Medication Sig Dispense Refill  . Copper Gluconate (COPPER CAPS) 2 MG CAPS Take 4 caps daily for 1 week, then 3 caps daily for 1 week, then 2 caps daily for 1 week, then stay on 1 cap daily  70 capsule  3  . Multiple Vitamin (MULTIVITAMIN) tablet Take 1 tablet by mouth daily.         No current facility-administered medications on file prior to visit.    Allergies: No Known Allergies  Family History: Family History  Problem Relation Age of Onset  . Hyperlipidemia Mother   . Hyperlipidemia Father     Social History: History   Social History  . Marital Status: Married    Spouse Name: N/A    Number of Children: N/A  . Years of Education: N/A   Occupational History  . Not on file.   Social History Main Topics  . Smoking status: Never Smoker   . Smokeless tobacco: Never Used  . Alcohol Use: 1.5 oz/week    3 drink(s)  per week     Comment: per week.  3-4 beers on the weekend  . Drug Use: No  . Sexual Activity: Not on file     Comment: engineer, married, regular exercise, 1 son   Other Topics Concern  . Not on file   Social History Narrative   He works as an Manufacturing systems engineer.  No exposure to toxic metals/solvents.   He lives with wife and two children.   Highest level of education:  B.S.    Review of Systems:  CONSTITUTIONAL: No fevers, chills, night sweats, or weight loss.   EYES: No visual changes or eye pain ENT: No hearing changes.  No history of nose bleeds.   RESPIRATORY: No cough,  wheezing and shortness of breath.   CARDIOVASCULAR: Negative for chest pain, and palpitations.   GI: Negative for abdominal discomfort, blood in stools or black stools.  No recent change in bowel habits.   GU:  No history of incontinence.   MUSCLOSKELETAL: No history of joint pain or swelling.  No myalgias.   SKIN: Negative for lesions, rash, and itching.   HEMATOLOGY/ONCOLOGY: Negative for prolonged bleeding, bruising easily, and swollen nodes.  No history of cancer.   ENDOCRINE: Negative for cold or heat intolerance, polydipsia or goiter.   PSYCH:  No depression or anxiety symptoms.   NEURO: As Above.   Vital Signs:  BP 104/78  Pulse 77  Ht 6' 1.23" (1.86 m)  Wt 192 lb 5 oz (87.232 kg)  BMI 25.21 kg/m2  SpO2 98%   General Medical Exam:   General:  Well appearing, comfortable.   Eyes/ENT: see cranial nerve examination.   Neck: No masses appreciated.  Full range of motion without tenderness.  No carotid bruits. Respiratory:  Clear to auscultation, good air entry bilaterally.   Cardiac:  Regular rate and rhythm, no murmur.   Extremities:  No deformities, edema, or skin discoloration. Good capillary refill.   Skin:  Skin color, texture, turgor normal. No rashes or lesions.  Neurological Exam: MENTAL STATUS including orientation to time, place, person, recent and remote memory, attention span and concentration, language, and fund of knowledge is normal.  Speech is not dysarthric.  CRANIAL NERVES: II:  No visual field defects.  Unremarkable fundi.   III-IV-VI: Pupils equal round and reactive to light.  Normal conjugate, extra-ocular eye movements in all directions of gaze.  No nystagmus.  No ptosis.   V:  Normal facial sensation.    VII:  Normal facial symmetry and movements.  No pathologic facial reflexes.  VIII:  Normal hearing and vestibular function.   IX-X:  Normal palatal movement.   XI:  Normal shoulder shrug and head rotation.   XII:  Normal tongue strength and range of  motion, no deviation or fasciculation.  MOTOR:  No atrophy, fasciculations or abnormal movements.  No pronator drift.  Tone is normal.    Right Upper Extremity:    Left Upper Extremity:    Deltoid  5/5   Deltoid  5/5   Biceps  5/5   Biceps  5/5   Triceps  5/5   Triceps  5/5   Wrist extensors  5/5   Wrist extensors  5/5   Wrist flexors  5/5   Wrist flexors  5/5   Finger extensors  5/5   Finger extensors  5/5   Finger flexors  5/5   Finger flexors  5/5   Dorsal interossei  5/5   Dorsal interossei  5/5   Abductor  pollicis  5/5   Abductor pollicis  5/5   Tone (Ashworth scale)  0  Tone (Ashworth scale)  0   Right Lower Extremity:    Left Lower Extremity:    Hip flexors  5/5   Hip flexors  5/5   Hip extensors  5/5   Hip extensors  5/5   Knee flexors  5/5   Knee flexors  5/5   Knee extensors  5/5   Knee extensors  5/5   Dorsiflexors  5/5   Dorsiflexors  5/5   Plantarflexors  5/5   Plantarflexors  5/5   Toe extensors  5/5   Toe extensors  5/5   Toe flexors  5/5   Toe flexors  5/5   Tone (Ashworth scale)  0  Tone (Ashworth scale)  0   MSRs:  Right                                                                 Left brachioradialis 3+  brachioradialis 3+  biceps 3+  biceps 3+  triceps 3+  triceps 3+  patellar 3+  patellar 3+  ankle jerk 2+  ankle jerk 2+  Hoffman no  Hoffman no  plantar response down  plantar response down  Spread with medial pectoralis reflex, no ankle clonus  SENSORY:  Normal and symmetric perception of light touch, pinprick, vibration, and proprioception.  Romberg's sign absent.   COORDINATION/GAIT: Normal finger-to- nose-finger and heel-to-shin.  Intact rapid alternating movements bilaterally.  Able to rise from a chair without using arms.  Gait narrow based and stable. Tandem and stressed gait intact.   Data:  Labs 12/27/2013:  RPR NR, HIV NR  CSF 12/23/2013:  W1 R0 G63  P53* Labs 12/02/2013:  ANA neg, SPEP/UPEP with IFE no M protein, CRP <0.5, ceruloplasmin  15**, copper 54**, celiac panel negative, c/p-ANCA neg Labs 11/27/2013:  TSH 2.1, vitamin B12 453, ESR 1  EMG of the right upper and lower extremities 12/11/2013: Impression:  1. Chronic right L5 radiculopathy, mild in degree electrically. 2. Mildly prolonged right ulnar sensory response is of unclear clinical significance. Late responses are prolonged throughout and may be due to patient's height; alternatively, this can be seen in a demyelinating polyradiculoneuropathy, but is thought to be less likely in this patient. 3. In particular, there is no evidence of a generalized sensorimotor polyneuropathy affecting the right side.  MRI brain wo contrast 12/28/2013:  stable and normal non-contrast MRI appearance of the brain.  MRI cervical spine wo contrast 12/28/2013: 1. New or progressed C5-C6 disc herniation to the left. Mild mass effect on the thecal sac without significant spinal stenosis. Involvement of the proximal left C6 nerve/nerve roots is possible.  2. Evolution of the small left foraminal disc protrusion at C6-C7. Stable moderate left C7 foraminal stenosis.  3. Stable chronic mild right C4 foraminal stenosis.  MRI lumbar spine wwo contrast 12/01/2013: L2-3 right posterior lateral/foraminal/lateral protrusion with compression of the right lateral aspect of the thecal sac and contained nerve roots. Foraminal extension with mild impression upon and displacement of the exiting right L2 nerve root.  L4-5 mild bulge. Mild facet joint degenerative changes.  L5-S1 mild disc desiccation. L5 inferior endplate small Schmorl's node deformity with bony reactive changes. Bulge asymmetric greater  to the right with contact with the upper S1 nerve root which does not appear compressed or displaced. Prominent right S1 nerve root sleeve.  MRI thoracic spine wwo contrast 12/01/2013: On the single sagittal sequence through the cervical spine obtained for proper level assignment, C5-6 disc protrusion and mild  cord flattening is noted (incompletely assessed on present exam) and appears to have progressed when compared to the 09/07/2011 cervical spine MR.  IMPRESSION: Mr. Tennell is a 43 year-old gentleman presenting for evaluation of paresthesias involving glove-stocking distribution and balance problems.  His exam shows relatively brisk and symmetric reflexes throughout without other associated UMN findings.  Tone and plantar responses are normal.  He has had extensive work-up including serology testing, CSF analysis, EMG, and imaging of the neuroaxis.  Notable findings include:  disc protrusion at C5-6, L2-3, and L5-S1 with mild cord flattening at C5-6; mild ulnar sensory prolonged distal latency and late responses; normal CSF testing; low copper and ceruloplasmin.   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  At this time, I would like to follow him clinically especially since his symptoms are slowly improving.   PLAN/RECOMMENDATIONS:  1. Check copper, ceruloplasmin, zinc, and vitamin E 2. Continue copper $RemoveBeforeD'2mg'[id number]$  daily 3. Return to clinic in 62-months    The duration of this appointment visit was 60 minutes of face-to-face time with the patient.  Greater than 50% of this time was spent in counseling, explanation of diagnosis, planning of further management, and coordination of care.   Thank you for allowing me to participate in patient's care.  If I can answer any additional questions, I would be pleased to do so.    Sincerely,    Donika K. Posey Pronto, DO

## 2014-02-10 ENCOUNTER — Encounter: Payer: BC Managed Care – PPO | Admitting: Neurology

## 2014-02-10 LAB — COPPER, SERUM: COPPER: 62 ug/dL — AB (ref 70–175)

## 2014-02-10 LAB — CERULOPLASMIN: Ceruloplasmin: 16 mg/dL — ABNORMAL LOW (ref 18–36)

## 2014-02-11 LAB — VITAMIN E
Gamma-Tocopherol (Vit E): 0.9 mg/L (ref <=4.3)
Vitamin E (Alpha Tocopherol): 13 mg/L (ref 5.7–19.9)

## 2014-02-11 LAB — ZINC: ZINC: 84 ug/dL (ref 60–130)

## 2014-04-18 ENCOUNTER — Other Ambulatory Visit: Payer: Self-pay

## 2014-05-12 ENCOUNTER — Encounter: Payer: Self-pay | Admitting: Neurology

## 2014-05-12 ENCOUNTER — Ambulatory Visit (INDEPENDENT_AMBULATORY_CARE_PROVIDER_SITE_OTHER): Payer: BC Managed Care – PPO | Admitting: Neurology

## 2014-05-12 VITALS — BP 110/78 | HR 71 | Ht 73.0 in | Wt 188.0 lb

## 2014-05-12 DIAGNOSIS — E538 Deficiency of other specified B group vitamins: Secondary | ICD-10-CM

## 2014-05-12 DIAGNOSIS — G320 Subacute combined degeneration of spinal cord in diseases classified elsewhere: Secondary | ICD-10-CM

## 2014-05-12 DIAGNOSIS — R2681 Unsteadiness on feet: Secondary | ICD-10-CM

## 2014-05-12 DIAGNOSIS — G629 Polyneuropathy, unspecified: Secondary | ICD-10-CM

## 2014-05-12 DIAGNOSIS — E610 Copper deficiency: Secondary | ICD-10-CM

## 2014-05-12 LAB — COMPREHENSIVE METABOLIC PANEL
ALT: 21 U/L (ref 0–53)
AST: 19 U/L (ref 0–37)
Albumin: 4.2 g/dL (ref 3.5–5.2)
Alkaline Phosphatase: 71 U/L (ref 39–117)
BUN: 18 mg/dL (ref 6–23)
CALCIUM: 9.5 mg/dL (ref 8.4–10.5)
CHLORIDE: 105 meq/L (ref 96–112)
CO2: 26 meq/L (ref 19–32)
CREATININE: 1.03 mg/dL (ref 0.50–1.35)
GLUCOSE: 98 mg/dL (ref 70–99)
Potassium: 4.5 mEq/L (ref 3.5–5.3)
Sodium: 139 mEq/L (ref 135–145)
Total Bilirubin: 0.6 mg/dL (ref 0.2–1.2)
Total Protein: 6.7 g/dL (ref 6.0–8.3)

## 2014-05-12 NOTE — Patient Instructions (Signed)
1.  Check copper, ceruloplasmin, and complete metabolic panel 2.  Continue copper 2mg  daily for now 3.  Return to clinic in 3-6 months

## 2014-05-12 NOTE — Progress Notes (Signed)
Follow-up Visit   Date: 05/12/2014    Travis [name] MRN: [id number] DOB: Aug 02, 1970   Interim History: Travis [name] is a 43 y.o. 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    History of present illness: Starting around 2012, he developed gradual onset of tingling of the tips of his fingers which progressed to involve his toes several months later. He later starting having intermittent numbness over the upper palate and tip and bladder incontinence, which is no longer present. He was evaluated by Dr. Jacelyn [name] in March 2013 at which time NCS/EMG of the lower extremities performed by Dr. Chales [name] was normal. Imaging of the neuraxis showed C5-6 disc protrusion and mild cord flattening and L2-3 protrusion with compression of the L2 nerve root. CSF testing was recommended, but patient deferred due to only have mild symptoms.   Starting Spring 2015, he noticed that when he was walking down a hallway, he needs to focus more on maintaining a straight line. He also started having numbness involving the entire hand, especially over the medial aspect. He saw Dr. Delice [name] in May 2015 for these symptoms and had repeat EMG which showed chronic L5 radiculopathy and mild prolonged ulnar and late responses. He has CSF testing which showed normal cell count, protein, and inflammatory markers. Serology testing showed low levels of copper and ceruloplasmin, so he was started on copper supplementation.  He feels that his balance is somewhat improved since mid-July, but numbness/tingling is still present. He has no weakness and has not had any falls. No dry eyes, dry mouth, bowel/bladder problems.   He lost 10lb unitentnionally over the past 2 years, so increased his intake of food and weiht has since stabilized.  UPDATE 05/12/2014:  There has been no worsening of  paresthesias.  He denies any pain, but reports to having constant low grade intensity of numbness involving the feet.  He continues to have intermittent spells of imbalance especially when taking a shower, no falls.  No new neurological complaints.   Medications:  Current Outpatient Prescriptions on File Prior to Visit  Medication Sig Dispense Refill  . Copper Gluconate (COPPER CAPS) 2 MG CAPS Take 4 caps daily for 1 week, then 3 caps daily for 1 week, then 2 caps daily for 1 week, then stay on 1 cap daily 70 capsule 3  . Multiple Vitamin (MULTIVITAMIN) tablet Take 1 tablet by mouth daily.       No current facility-administered medications on file prior to visit.    Allergies: No Known Allergies   Review of Systems:  CONSTITUTIONAL: No fevers, chills, night sweats, or weight loss.   EYES: No visual changes or eye pain ENT: No hearing changes.  No history of nose bleeds.   RESPIRATORY: No cough, wheezing and shortness of breath.   CARDIOVASCULAR: Negative for chest pain, and palpitations.   GI: Negative for abdominal discomfort, blood in stools or black stools.  No recent change in bowel habits.   GU:  No history of incontinence.   MUSCLOSKELETAL: No history of joint pain or swelling.  No myalgias.   SKIN: Negative for lesions, rash, and itching.   ENDOCRINE: Negative for cold or heat intolerance, polydipsia or goiter.   PSYCH:  No depression or anxiety symptoms.   NEURO: As Above.   Vital Signs:  BP 110/78 mmHg  Pulse 71  Ht $R'6\' 1"'Cu$  (1.854 m)  Wt 188 lb (85.276 kg)  BMI 24.81 kg/m2  SpO2 96%   Neurological Exam: MENTAL STATUS including orientation to time, place, person, recent and remote memory, attention span and concentration, language, and fund of knowledge is normal.  Speech is not dysarthric.  CRANIAL NERVES: No visual field defects. Pupils equal round and reactive to light.  Normal conjugate, extra-ocular eye movements in all directions of gaze.  No ptosis. Normal  facial sensation.  Face is symmetric. Palate elevates symmetrically.  Tongue is midline.  MOTOR:  Motor strength is 5/5 in all extremities. No pronator drift.  Tone is normal.    MSRs:  Right                                                                 Left brachioradialis 3+  brachioradialis 3+  biceps 3+  biceps 3+  triceps 3+  triceps 3+  patellar 3+  patellar 3+  ankle jerk 2+  ankle jerk 2+  Hoffman no  Hoffman no  plantar response down  plantar response down  Medial pectoralis and crossed adductors.  Plantar responses are flexor.    SENSORY:  Intact to vibration throughout.  Rhomberg is mildly positive.  COORDINATION/GAIT:  Normal finger-to- nose-finger and heel-to-shin.  Intact rapid alternating movements bilaterally.  Gait narrow based and stable. Slightly unsteady with tandem gait, but able to perform.  Data: Labs 02/07/2014:  Ceruloplasmin 16, copper 64, zinc 84 Labs 12/27/2013: RPR NR, HIV NR  CSF 12/23/2013: W1 R0 G63 P53* Labs 12/02/2013: ANA neg, SPEP/UPEP with IFE no M protein, CRP <0.5, ceruloplasmin 15**, copper 54**, celiac panel negative, c/p-ANCA neg Labs 11/27/2013: TSH 2.1, vitamin B12 453, ESR 1  EMG of the right upper and lower extremities 12/11/2013: Impression:  1. Chronic right L5 radiculopathy, mild in degree electrically. 2. Mildly prolonged right ulnar sensory response is of unclear clinical significance. Late responses are prolonged throughout and may be due to patient's height; alternatively, this can be seen in a demyelinating polyradiculoneuropathy, but is thought to be less likely in this patient. 3. In particular, there is no evidence of a generalized sensorimotor polyneuropathy affecting the right side.  MRI brain wo contrast 12/28/2013: stable and normal non-contrast MRI appearance of the brain.  MRI cervical spine wo contrast 12/28/2013: 1. New or progressed C5-C6 disc herniation to the left. Mild mass effect on the thecal sac without  significant spinal stenosis. Involvement of the proximal left C6 nerve/nerve roots is possible.  2. Evolution of the small left foraminal disc protrusion at C6-C7. Stable moderate left C7 foraminal stenosis.  3. Stable chronic mild right C4 foraminal stenosis.  MRI lumbar spine wwo contrast 12/01/2013: L2-3 right posterior lateral/foraminal/lateral protrusion with compression of the right lateral aspect of the thecal sac and contained nerve roots. Foraminal extension with mild impression upon and displacement of the exiting right L2 nerve root.  L4-5 mild bulge. Mild facet joint degenerative changes.  L5-S1 mild disc desiccation. L5 inferior endplate small Schmorl's node deformity with bony reactive changes. Bulge asymmetric greater to the right with contact with the upper S1 nerve root which does not appear compressed or displaced. Prominent right S1 nerve root sleeve.  MRI thoracic spine wwo contrast 12/01/2013: On the single sagittal sequence through the cervical spine obtained  for proper level assignment, C5-6 disc protrusion and mild cord flattening is noted (incompletely assessed on present exam) and appears to have progressed when compared to the 09/07/2011 cervical spine MR.   IMPRESSION/PLAN:   Travis [name] is a 43 year-old gentleman presenting for evaluation of paresthesias involving glove-stocking distribution and balance problems. His exam shows relatively brisk and symmetric reflexes throughout without other associated UMN findings. Tone and plantar responses are normal. He has had extensive work-up including serology testing, CSF analysis, EMG, and imaging of the neuroaxis. Notable findings include: disc protrusion at C5-6, L2-3, and L5-S1 with mild cord flattening at C5-6; mild ulnar sensory prolonged distal latency and late responses; normal CSF testing; low copper and ceruloplasmin.  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   He has been on copper supplementation for 50-months, so would like to recheck copper, ceruloplasmin, and CMP.  If within normal limits, plan to discontinue and follow clinically, otherwise, continue copper $RemoveBeforeD'2mg'[id number]$  daily.    Clinically, there has been no worsening of symptoms, but no significant improvement since his last visit.  I explained the recovery may be slow since this has been ongoing for several years.  Return to clinic 3-6 months   The duration of this appointment visit was 20 minutes of face-to-face time with the patient.  Greater than 50% of this time was spent in counseling, explanation of diagnosis, planning of further management, and coordination of care.   Thank you for allowing me to participate in patient's care.  If I can answer any additional questions, I would be pleased to do so.    Sincerely,    Donika K. Posey Pronto, DO

## 2014-05-14 LAB — COPPER, SERUM: Copper: 54 ug/dL — ABNORMAL LOW (ref 70–175)

## 2014-05-14 LAB — CERULOPLASMIN: Ceruloplasmin: 17 mg/dL — ABNORMAL LOW (ref 18–36)

## 2014-05-15 ENCOUNTER — Other Ambulatory Visit: Payer: Self-pay | Admitting: *Deleted

## 2014-05-15 DIAGNOSIS — E610 Copper deficiency: Secondary | ICD-10-CM

## 2014-07-01 ENCOUNTER — Ambulatory Visit: Payer: BC Managed Care – PPO | Admitting: Dietician

## 2014-12-02 ENCOUNTER — Ambulatory Visit (INDEPENDENT_AMBULATORY_CARE_PROVIDER_SITE_OTHER): Payer: 59 | Admitting: Physician Assistant

## 2014-12-02 ENCOUNTER — Encounter: Payer: Self-pay | Admitting: Physician Assistant

## 2014-12-02 VITALS — BP 117/83 | HR 70 | Temp 98.0°F | Ht 73.0 in | Wt 186.2 lb

## 2014-12-02 DIAGNOSIS — B9689 Other specified bacterial agents as the cause of diseases classified elsewhere: Secondary | ICD-10-CM

## 2014-12-02 DIAGNOSIS — J208 Acute bronchitis due to other specified organisms: Principal | ICD-10-CM

## 2014-12-02 DIAGNOSIS — J00 Acute nasopharyngitis [common cold]: Secondary | ICD-10-CM

## 2014-12-02 MED ORDER — PSEUDOEPHEDRINE-GUAIFENESIN ER 60-600 MG PO TB12
1.0000 | ORAL_TABLET | Freq: Two times a day (BID) | ORAL | Status: DC
Start: 2014-12-02 — End: 2015-03-26

## 2014-12-02 MED ORDER — CLARITHROMYCIN 250 MG PO TABS
250.0000 mg | ORAL_TABLET | Freq: Two times a day (BID) | ORAL | Status: DC
Start: 2014-12-02 — End: 2015-03-26

## 2014-12-02 NOTE — Progress Notes (Signed)
   Patient presents to clinic today c/o chest congestion and productive cough x 4 weeks.  Sputum is yellow-green.  Denies fever, chills, SOB.  Endorses chest tightness and pain with coughing.  Denies recent travel or sick contact.  Has not taken anything for symptoms.  Past Medical History  Diagnosis Date  . Hyperlipidemia   . History of chicken pox     childhood    No current outpatient prescriptions on file prior to visit.   No current facility-administered medications on file prior to visit.   No Known Allergies  Family History  Problem Relation Age of Onset  . Hyperlipidemia Mother   . Hyperlipidemia Father     History   Social History  . Marital Status: Married    Spouse Name: N/A  . Number of Children: N/A  . Years of Education: N/A   Social History Main Topics  . Smoking status: Never Smoker   . Smokeless tobacco: Never Used  . Alcohol Use: 1.5 oz/week    3 drink(s) per week     Comment: per week.  3-4 beers on the weekend  . Drug Use: No  . Sexual Activity: Not on file     Comment: engineer, married, regular exercise, 1 son   Other Topics Concern  . None   Social History Narrative   He works as an Manufacturing systems engineertest engineer.  No exposure to toxic metals/solvents.   He lives with wife and two children.   Highest level of education:  B.S.   Review of Systems - See HPI.  All other ROS are negative.  BP 117/83 mmHg  Pulse 70  Temp(Src) 98 F (36.7 C) (Oral)  Ht 6\' 1"  (1.854 m)  Wt 186 lb 3.2 oz (84.46 kg)  BMI 24.57 kg/m2  SpO2 100%  Physical Exam  Constitutional: He is oriented to person, place, and time and well-developed, well-nourished, and in no distress.  HENT:  Head: Normocephalic and atraumatic.  Right Ear: External ear normal.  Left Ear: External ear normal.  Nose: Nose normal.  Mouth/Throat: Oropharynx is clear and moist. No oropharyngeal exudate.  TM within normal limits bilaterally.  Eyes: Conjunctivae are normal. Pupils are equal, round, and  reactive to light.  Neck: Neck supple.  Cardiovascular: Normal rate, regular rhythm, normal heart sounds and intact distal pulses.   Pulmonary/Chest: Effort normal and breath sounds normal. No respiratory distress. He has no wheezes. He has no rales. He exhibits no tenderness.  Neurological: He is alert and oriented to person, place, and time.  Skin: Skin is warm and dry. No rash noted.  Psychiatric: Affect normal.  Vitals reviewed.    Assessment/Plan: Acute bacterial bronchitis Rx Biaxin.  Rx Mucinex-D.  Supportive measures reviewed.  Follow-up if not improving.

## 2014-12-02 NOTE — Patient Instructions (Signed)
Please take antibiotic and mucinex-d as directed.  Stay well hydrated and get plenty of rest. Place a humidifier in the bedroom. Call or return to clinic if symptoms are not improving.

## 2014-12-02 NOTE — Progress Notes (Signed)
Pre visit review using our clinic review tool, if applicable. No additional management support is needed unless otherwise documented below in the visit note. 

## 2014-12-02 NOTE — Assessment & Plan Note (Signed)
Rx Biaxin.  Rx Mucinex-D.  Supportive measures reviewed.  Follow-up if not improving.

## 2015-03-19 ENCOUNTER — Telehealth: Payer: Self-pay | Admitting: Neurology

## 2015-03-19 DIAGNOSIS — G629 Polyneuropathy, unspecified: Secondary | ICD-10-CM

## 2015-03-19 NOTE — Telephone Encounter (Signed)
Pt called and would like Dr Karel Jarvis refer him to another Neurologist/Dawn CB# [phone]

## 2015-03-19 NOTE — Telephone Encounter (Signed)
Please review

## 2015-03-20 NOTE — Telephone Encounter (Signed)
Pls let him know that at this point, I think he would benefit from being evaluated at an academic center such as [facility] or [place]. 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 Thanks

## 2015-03-23 NOTE — Telephone Encounter (Signed)
Called patient and spoke with him about Dr. Rosalyn Gess advisement. He wants to be referred to [facility]. I will process referral and call him back with appt information.

## 2015-03-26 ENCOUNTER — Telehealth: Payer: Self-pay | Admitting: Behavioral Health

## 2015-03-26 ENCOUNTER — Encounter: Payer: Self-pay | Admitting: Behavioral Health

## 2015-03-26 NOTE — Telephone Encounter (Signed)
Pre-Visit Call completed with patient and chart updated.   Pre-Visit Info documented in Specialty Comments under Snap[employer].    

## 2015-03-26 NOTE — Telephone Encounter (Signed)
Called [facility]. [facility] requires record to be sent 1st for review before scheduling a new patient appt. Once the patient's records are reviewed a decision will be made on which doctor would be most helpful to the patient and they will call the patient to scheduled an appt.   Notes faxed to [facility] @ [phone].

## 2015-03-27 ENCOUNTER — Other Ambulatory Visit: Payer: Self-pay | Admitting: Physician Assistant

## 2015-03-27 ENCOUNTER — Ambulatory Visit (INDEPENDENT_AMBULATORY_CARE_PROVIDER_SITE_OTHER): Payer: 59 | Admitting: Physician Assistant

## 2015-03-27 ENCOUNTER — Encounter: Payer: Self-pay | Admitting: Physician Assistant

## 2015-03-27 VITALS — BP 116/78 | HR 69 | Temp 97.9°F | Resp 16 | Ht 73.0 in | Wt 181.4 lb

## 2015-03-27 DIAGNOSIS — R42 Dizziness and giddiness: Secondary | ICD-10-CM

## 2015-03-27 DIAGNOSIS — G4750 Parasomnia, unspecified: Secondary | ICD-10-CM | POA: Diagnosis not present

## 2015-03-27 DIAGNOSIS — Z0000 Encounter for general adult medical examination without abnormal findings: Secondary | ICD-10-CM

## 2015-03-27 DIAGNOSIS — G629 Polyneuropathy, unspecified: Secondary | ICD-10-CM

## 2015-03-27 DIAGNOSIS — R202 Paresthesia of skin: Secondary | ICD-10-CM | POA: Diagnosis not present

## 2015-03-27 LAB — COMPREHENSIVE METABOLIC PANEL
ALT: 20 U/L (ref 9–46)
AST: 19 U/L (ref 10–40)
Albumin: 4.4 g/dL (ref 3.6–5.1)
Alkaline Phosphatase: 59 U/L (ref 40–115)
BILIRUBIN TOTAL: 0.9 mg/dL (ref 0.2–1.2)
BUN: 17 mg/dL (ref 7–25)
CALCIUM: 9.3 mg/dL (ref 8.6–10.3)
CO2: 26 mmol/L (ref 20–31)
Chloride: 103 mmol/L (ref 98–110)
Creat: 0.88 mg/dL (ref 0.60–1.35)
GLUCOSE: 85 mg/dL (ref 65–99)
POTASSIUM: 4.3 mmol/L (ref 3.5–5.3)
Sodium: 137 mmol/L (ref 135–146)
TOTAL PROTEIN: 7.1 g/dL (ref 6.1–8.1)

## 2015-03-27 LAB — LIPID PANEL
CHOLESTEROL: 189 mg/dL (ref 125–200)
HDL: 52 mg/dL (ref >=40)
LDL Cholesterol: 124 mg/dL (ref <130)
Total CHOL/HDL Ratio: 3.6 Ratio (ref <=5.0)
Triglycerides: 66 mg/dL (ref <150)
VLDL: 13 mg/dL (ref <30)

## 2015-03-27 MED ORDER — MECLIZINE HCL 25 MG PO TABS: 25.0000 mg | ORAL_TABLET | Freq: Three times a day (TID) | ORAL | Status: AC | PRN

## 2015-03-27 NOTE — Progress Notes (Signed)
Pre visit review using our clinic review tool, if applicable. No additional management support is needed unless otherwise documented below in the visit note/SLS  

## 2015-03-27 NOTE — Progress Notes (Signed)
Patient presents to clinic today for annual exam.  Patient is fasting for labs.  Acute Concerns: Patient endorses dizziness on standing throughout the day. Has history of gait instability and tingling of extremities. Is followed by Neurology with previous workup only remarkable for low copper. Endorses taking supplements as directed without change in symptoms. Endorses difficulty intermittently with fine motor skills including hand writing and typing. Still endorses issues with balance.  Last MRI 12/28/2013 without abnormal findings. NCV and EMG on 12/11/2013 without worrisome findings. Denies AMS or vision changes. Was told by Neurology at last visit that they recommended evaluation at [facility]. Is wanting to proceed with referral.   Health Maintenance: Dental -- up-to-date Vision -- up-to-date Immunizations -- Will be getting flu shot at work. Tetanus up-to-date  Past Medical History  Diagnosis Date  . Hyperlipidemia   . History of chicken pox     childhood    Past Surgical History  Procedure Laterality Date  . No past surgeries  07/08/2011    No current outpatient prescriptions on file prior to visit.   No current facility-administered medications on file prior to visit.    No Known Allergies  Family History  Problem Relation Age of Onset  . Hyperlipidemia Mother   . Hyperlipidemia Father     Social History   Social History  . Marital Status: Married    Spouse Name: N/A  . Number of Children: N/A  . Years of Education: N/A   Occupational History  . Not on file.   Social History Main Topics  . Smoking status: Never Smoker   . Smokeless tobacco: Never Used  . Alcohol Use: 1.5 oz/week    3 drink(s) per week     Comment: per week.  3-4 beers on the weekend  . Drug Use: No  . Sexual Activity: Not on file     Comment: engineer, married, regular exercise, 1 son   Other Topics Concern  . Not on file   Social History Narrative   He works as an  Insurance underwriter.  No exposure to toxic metals/solvents.   He lives with wife and two children.   Highest level of education:  B.S.   Review of Systems  Constitutional: Negative for fever, weight loss and malaise/fatigue.  HENT: Negative for ear discharge, ear pain, hearing loss and tinnitus.   Eyes: Negative for blurred vision, double vision, photophobia and pain.  Respiratory: Negative for cough and shortness of breath.   Cardiovascular: Negative for chest pain and palpitations.  Gastrointestinal: Negative for heartburn, nausea, vomiting, abdominal pain, diarrhea, constipation, blood in stool and melena.  Genitourinary: Negative for dysuria, urgency, frequency, hematuria and flank pain.  Musculoskeletal: Negative for falls.  Neurological: Positive for dizziness, tingling and sensory change. Negative for speech change, focal weakness, seizures, loss of consciousness and headaches.  Endo/Heme/Allergies: Negative for environmental allergies.  Psychiatric/Behavioral: Negative for depression, suicidal ideas, hallucinations and substance abuse. The patient is not nervous/anxious and does not have insomnia.     BP 116/78 mmHg  Pulse 69  Temp(Src) 97.9 F (36.6 C) (Oral)  Resp 16  Ht $R'6\' 1"'aI$  (1.854 m)  Wt 181 lb 6 oz (82.271 kg)  BMI 23.93 kg/m2  SpO2 98%  Physical Exam  Constitutional: He is oriented to person, place, and time and well-developed, well-nourished, and in no distress.  HENT:  Head: Normocephalic and atraumatic.  Right Ear: External ear normal.  Left Ear: External ear normal.  Nose: Nose normal.  Mouth/Throat: Oropharynx is clear and moist. No oropharyngeal exudate.  Eyes: Conjunctivae and EOM are normal. Pupils are equal, round, and reactive to light.  Neck: Neck supple. No thyromegaly present.  Cardiovascular: Normal rate, regular rhythm, normal heart sounds and intact distal pulses.   Pulmonary/Chest: Effort normal and breath sounds normal. No respiratory distress. He has  no wheezes. He has no rales. He exhibits no tenderness.  Abdominal: Soft. Bowel sounds are normal. He exhibits no distension and no mass. There is no tenderness. There is no rebound and no guarding.  Genitourinary: Testes/scrotum normal.  Lymphadenopathy:    He has no cervical adenopathy.  Neurological: He is alert and oriented to person, place, and time. He has normal motor skills and normal strength. No cranial nerve deficit. He has a normal Cerebellar Exam and a normal Heel to [employer]. He shows no pronator drift.  Skin: Skin is warm and dry. No rash noted.  Psychiatric: Affect normal.  Vitals reviewed.   Recent Results (from the past 2160 hour(s))  RPR     Status: None   Collection Time: 03/27/15  3:27 PM  Result Value Ref Range   RPR Ser Ql NON REAC NON REAC  Copper, Serum     Status: None (Preliminary result)   Collection Time: 03/27/15  3:27 PM  Result Value Ref Range   Copper    Lyme Ab/Western Blot Reflex     Status: None   Collection Time: 03/27/15  3:27 PM  Result Value Ref Range   B burgdorferi Ab IgG+IgM 0.29 ISR    Comment: Antibody to Borrelia burgdorferi not detected.      ISR = Immune Status Ratio              <0.90         ISR       Negative              0.90 - 1.09   ISR       Equivocal              >=1.10        ISR       Positive   Hemoglobin A1C     Status: Abnormal   Collection Time: 03/27/15  5:59 PM  Result Value Ref Range   Hgb A1c MFr Bld 5.9 (H) <5.7 %    Comment:                                                                        According to the ADA Clinical Practice Recommendations for 2011, when HbA1c is used as a screening test:     >=6.5%   Diagnostic of Diabetes Mellitus            (if abnormal result is confirmed)   5.7-6.4%   Increased risk of developing Diabetes Mellitus   References:Diagnosis and Classification of Diabetes Mellitus,Diabetes UJWJ,1914,78(GNFAO 1):S62-S69 and Standards of Medical Care in         Diabetes -  2011,Diabetes Care,2011,34 (Suppl 1):S11-S61.      Mean Plasma Glucose 123 (H) <117 mg/dL  Comp Met (CMET)     Status: None   Collection Time: 03/27/15  5:59 PM  Result Value Ref  Range   Sodium 137 135 - 146 mmol/L   Potassium 4.3 3.5 - 5.3 mmol/L   Chloride 103 98 - 110 mmol/L   CO2 26 20 - 31 mmol/L   Glucose, Bld 85 65 - 99 mg/dL   BUN 17 7 - 25 mg/dL   Creat 0.88 0.60 - 1.35 mg/dL   Total Bilirubin 0.9 0.2 - 1.2 mg/dL   Alkaline Phosphatase 59 40 - 115 U/L   AST 19 10 - 40 U/L   ALT 20 9 - 46 U/L   Total Protein 7.1 6.1 - 8.1 g/dL   Albumin 4.4 3.6 - 5.1 g/dL   Calcium 9.3 8.6 - 10.3 mg/dL  Lipid Profile     Status: None   Collection Time: 03/27/15  5:59 PM  Result Value Ref Range   Cholesterol 189 125 - 200 mg/dL   Triglycerides 66 <150 mg/dL   HDL 52 >=40 mg/dL   Total CHOL/HDL Ratio 3.6 <=5.0 Ratio   VLDL 13 <30 mg/dL   LDL Cholesterol 124 <130 mg/dL    Comment:   Total Cholesterol/HDL Ratio:CHD Risk                        Coronary Heart Disease Risk Table                                        Men       Women          1/2 Average Risk              3.4        3.3              Average Risk              5.0        4.4           2X Average Risk              9.6        7.1           3X Average Risk             23.4       11.0 Use the calculated Patient Ratio above and the CHD Risk table  to determine the patient's CHD Risk.     Assessment/Plan: Visit for preventive health examination Depression screen negative. Health Maintenance reviewed -- Tetanus up-to-date. Defers flu shot to have at work Preventive schedule discussed and handout given in AVS. Will obtain fasting labs today.   Paresthesia Deteriorating per patient. Exam unremarkable. Will add-on Lyme titer, RPR, TSH and copper level to preventive workup to assess. Will repeat MRI and referral to [facility] placed. Some positional vertigo present. Epley maneuver discussed. Handout given. Rx  meclizine.  Dizziness Some positional vertigo present. Epley maneuver discussed. Handout given. Rx meclizine. Giving history -- MRI will be obtained.

## 2015-03-27 NOTE — Patient Instructions (Addendum)
Please go to the lab for blood work. We will call you with your results.  Please take Meclizine as directed when needed for dizziness. Stay well hydrated and add a gatorade daily.  Follow the exercises below to help resolve the vertigo.  You will be contacted for assessment by Neurology at [facility]. You will be contacted for an MRI. If anything acutely worsens, please go to the ER.   Epley Maneuver Self-Care WHAT IS THE EPLEY MANEUVER? The Epley maneuver is an exercise you can do to relieve symptoms of benign paroxysmal positional vertigo (BPPV). This condition is often just referred to as vertigo. BPPV is caused by the movement of tiny crystals (canaliths) inside your inner ear. The accumulation and movement of canaliths in your inner ear causes a sudden spinning sensation (vertigo) when you move your head to certain positions. Vertigo usually lasts about 30 seconds. BPPV usually occurs in just one ear. If you get vertigo when you lie on your left side, you probably have BPPV in your left ear. Your health care provider can tell you which ear is involved.  BPPV may be caused by a head injury. Many people older than 50 get BPPV for unknown reasons. If you have been diagnosed with BPPV, your health care provider may teach you how to do this maneuver. BPPV is not life threatening (benign) and usually goes away in time.  WHEN SHOULD I PERFORM THE EPLEY MANEUVER? You can do this maneuver at home whenever you have symptoms of vertigo. You may do the Epley maneuver up to 3 times a day until your symptoms of vertigo go away. HOW SHOULD I DO THE EPLEY MANEUVER? 1. Sit on the edge of a bed or table with your back straight. Your legs should be extended or hanging over the edge of the bed or table.  2. Turn your head halfway toward the affected ear.  3. Lie backward quickly with your head turned until you are lying flat on your back. You may want to position a pillow under your shoulders.  4. Hold this  position for 30 seconds. You may experience an attack of vertigo. This is normal. Hold this position until the vertigo stops. 5. Then turn your head to the opposite direction until your unaffected ear is facing the floor.  6. Hold this position for 30 seconds. You may experience an attack of vertigo. This is normal. Hold this position until the vertigo stops. 7. Now turn your whole body to the same side as your head. Hold for another 30 seconds.  8. You can then sit back up. ARE THERE RISKS TO THIS MANEUVER? In some cases, you may have other symptoms (such as changes in your vision, weakness, or numbness). If you have these symptoms, stop doing the maneuver and call your health care provider. Even if doing these maneuvers relieves your vertigo, you may still have dizziness. Dizziness is the sensation of light-headedness but without the sensation of movement. Even though the Epley maneuver may relieve your vertigo, it is possible that your symptoms will return within 5 years. WHAT SHOULD I DO AFTER THIS MANEUVER? After doing the Epley maneuver, you can return to your normal activities. Ask your doctor if there is anything you should do at home to prevent vertigo. This may include:  Sleeping with two or more pillows to keep your head elevated.  Not sleeping on the side of your affected ear.  Getting up slowly from bed.  Avoiding sudden movements during the day.  Avoiding extreme head movement, like looking up or bending over.  Wearing a cervical collar to prevent sudden head movements. WHAT SHOULD I DO IF MY SYMPTOMS GET WORSE? Call your health care provider if your vertigo gets worse. Call your provider right way if you have other symptoms, including:   Nausea.  Vomiting.  Headache.  Weakness.  Numbness.  Vision changes. Document Released: 06/25/2013 Document Reviewed: 06/25/2013 [facility] Patient Information 2015 [place], [place]. This information is not intended to replace advice  given to you by your health care provider. Make sure you discuss any questions you have with your health care provider.

## 2015-03-28 LAB — LYME AB/WESTERN BLOT REFLEX: B burgdorferi Ab IgG+IgM: 0.29 {ISR}

## 2015-03-28 LAB — HEMOGLOBIN A1C
HEMOGLOBIN A1C: 5.9 % — AB (ref <5.7)
Mean Plasma Glucose: 123 mg/dL — ABNORMAL HIGH (ref <117)

## 2015-03-28 LAB — RPR

## 2015-03-29 DIAGNOSIS — R42 Dizziness and giddiness: Secondary | ICD-10-CM | POA: Insufficient documentation

## 2015-03-29 LAB — COPPER, SERUM: Copper: 57 ug/dL — ABNORMAL LOW (ref 70–175)

## 2015-03-29 NOTE — Assessment & Plan Note (Signed)
Depression screen negative. Health Maintenance reviewed -- Tetanus up-to-date. Defers flu shot to have at work Preventive schedule discussed and handout given in AVS. Will obtain fasting labs today.

## 2015-03-29 NOTE — Assessment & Plan Note (Signed)
Deteriorating per patient. Exam unremarkable. Will add-on Lyme titer, RPR, TSH and copper level to preventive workup to assess. Will repeat MRI and referral to [facility] placed. Some positional vertigo present. Epley maneuver discussed. Handout given. Rx meclizine.

## 2015-03-29 NOTE — Assessment & Plan Note (Signed)
Some positional vertigo present. Epley maneuver discussed. Handout given. Rx meclizine. Giving history -- MRI will be obtained.

## 2015-03-30 LAB — URINALYSIS, ROUTINE W REFLEX MICROSCOPIC
BILIRUBIN URINE: NEGATIVE
Glucose, UA: NEGATIVE
Hgb urine dipstick: NEGATIVE
KETONES UR: NEGATIVE
Leukocytes, UA: NEGATIVE
Nitrite: NEGATIVE
Protein, ur: NEGATIVE
Specific Gravity, Urine: 1.025 (ref 1.001–1.035)
pH: 6.5 (ref 5.0–8.0)

## 2015-04-10 ENCOUNTER — Encounter: Payer: Self-pay | Admitting: Neurology

## 2015-04-10 ENCOUNTER — Ambulatory Visit (INDEPENDENT_AMBULATORY_CARE_PROVIDER_SITE_OTHER): Payer: 59 | Admitting: Neurology

## 2015-04-10 ENCOUNTER — Other Ambulatory Visit (INDEPENDENT_AMBULATORY_CARE_PROVIDER_SITE_OTHER): Payer: 59

## 2015-04-10 VITALS — BP 110/78 | HR 72 | Wt 183.4 lb

## 2015-04-10 DIAGNOSIS — E610 Copper deficiency: Secondary | ICD-10-CM | POA: Diagnosis not present

## 2015-04-10 DIAGNOSIS — E538 Deficiency of other specified B group vitamins: Secondary | ICD-10-CM

## 2015-04-10 DIAGNOSIS — R278 Other lack of coordination: Secondary | ICD-10-CM

## 2015-04-10 DIAGNOSIS — G320 Subacute combined degeneration of spinal cord in diseases classified elsewhere: Secondary | ICD-10-CM

## 2015-04-10 DIAGNOSIS — R2681 Unsteadiness on feet: Secondary | ICD-10-CM

## 2015-04-10 LAB — CBC
HEMATOCRIT: 45.8 % (ref 39.0–52.0)
HEMOGLOBIN: 15.4 g/dL (ref 13.0–17.0)
MCHC: 33.5 g/dL (ref 30.0–36.0)
MCV: 96.6 fl (ref 78.0–100.0)
Platelets: 192 10*3/uL (ref 150.0–400.0)
RBC: 4.74 Mil/uL (ref 4.22–5.81)
RDW: 13.1 % (ref 11.5–15.5)
WBC: 4.3 10*3/uL (ref 4.0–10.5)

## 2015-04-10 NOTE — Patient Instructions (Addendum)
1.  Check blood work 2.  We will call you with the results of the testing 3.  Increase copper to  daily as your levels remain low

## 2015-04-10 NOTE — Progress Notes (Signed)
Follow-up Visit   Date: 04/10/2015    Travis [name] MRN: [id number] DOB: [birth year]   Interim History: Travis [name] is a 44 y.o. 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    History of present illness: Starting around 2012, he developed gradual onset of tingling of the tips of his fingers which progressed to involve his toes several months later. He later starting having intermittent numbness over the upper palate and tip and bladder incontinence, which is no longer present. He was evaluated by Dr. Modesto Charon in March 2013 at which time NCS/EMG of the lower extremities performed by Dr. Stacy Gardner was normal. Imaging of the neuraxis showed C5-6 disc protrusion and mild cord flattening and L2-3 protrusion with compression of the L2 nerve root. CSF testing was recommended, but patient deferred due to only have mild symptoms.   Starting Spring 2015, he noticed that when he was walking down a hallway, he needs to focus more on maintaining a straight line. He also started having numbness involving the entire hand, especially over the medial aspect. He saw Dr. Karel Jarvis in May 2015 for these symptoms and had repeat EMG which showed chronic L5 radiculopathy and mild prolonged ulnar and late responses. He has CSF testing which showed normal cell count, protein, and inflammatory markers. Serology testing showed low levels of copper and ceruloplasmin, so he was started on copper supplementation.  He has no weakness and has not had any falls. No dry eyes, dry mouth, bowel/bladder problems.   He lost 10lb unitentnionally over the past 2 years, so increased his intake of food and weiht has since stabilized.  UPDATE 05/12/2014:  There has been no worsening of paresthesias.  He denies any pain, but reports to having constant low grade intensity of numbness involving the  feet.  He continues to have intermittent spells of imbalance especially when taking a shower, no falls.  No new neurological complaints.  UPDATE 04/10/2015:  Patient was last seen in the office almost one year ago.  He continues to have numbness/tingling of the hands and feet and balance difficulty.  He is now having problems with dexterity, hand writing, difficulty with running and riding his bike, going down stairs.  He is having problems with balance and reports having a few falls this year.  He has new complaints of stuttering or stammering.  Muscles in his legs are cramping more than before.  He had a spell of nausea and vertigo last week.  He is having urinary leakage, which is more common when he is traveling.  He is scheduled for second opinion at [facility] on December 8th.   He remains on copper 2mg  but recent levels are still low.    Medications:  Current Outpatient Prescriptions on File Prior to Visit  Medication Sig Dispense Refill  . meclizine (ANTIVERT) 25 MG tablet Take 1 tablet (25 mg total) by mouth 3 (three) times daily as needed for dizziness. 30 tablet 0   No current facility-administered medications on file prior to visit.    Allergies: No Known Allergies   Review of Systems:  CONSTITUTIONAL: No fevers, chills, night sweats, or weight loss.   EYES: No visual changes or eye pain ENT: No hearing changes.  No history of nose bleeds.   RESPIRATORY: No cough, wheezing and shortness of breath.   CARDIOVASCULAR: Negative for chest pain, and palpitations.  GI: Negative for abdominal discomfort, blood in stools or black stools.  No recent change in bowel habits.   GU:  No history of incontinence.   MUSCLOSKELETAL: No history of joint pain or swelling.  No myalgias.   SKIN: Negative for lesions, rash, and itching.   ENDOCRINE: Negative for cold or heat intolerance, polydipsia or goiter.   PSYCH:  No depression or anxiety symptoms.   NEURO: As Above.   Vital Signs:   BP 110/78 mmHg  Pulse 72  Wt 183 lb 7 oz (83.207 kg)  SpO2 99%  General:  Well appearing Cardiac:  Regular rate and rhythm, no murmur Pulm:  Clear to auscultation Extremities:  No rash or edema  Neurological Exam: MENTAL STATUS including orientation to time, place, person, recent and remote memory, attention span and concentration, language, and fund of knowledge is normal.  Speech is slightly slower and dysarthric.  Gutteral sound imprecise.   CRANIAL NERVES:  No visual field defects. Pupils equal round and reactive to light.  Normal conjugate, extra-ocular eye movements in all directions of gaze.  Smooth pursuit and saccadic movement intact. No ptosis. Normal facial sensation.  Face is symmetric. Palate elevates symmetrically.  Tongue is midline.  Absent pathological facial reflexes.   MOTOR:  Motor strength is 5/5 in all extremities. No pronator drift.  The tone of his arms and legs are mildly increased Ashworth 0+ (new)   MSRs:  Right                                                                 Left brachioradialis 3+  brachioradialis 3+  biceps 3+  biceps 3+  triceps 3+  triceps 3+  patellar 3+  patellar 3+  ankle jerk 2+  ankle jerk 2+  plantar response down  plantar response down  Medial pectoralis and crossed adductors.  Plantar responses are flexor.   2-3 beat ankle clonus  SENSORY:  Intact to vibration throughout. Hyperesthesia with pin prick distal to ankles bilaterally. Rhomberg is mildly positive.  COORDINATION/GAIT:  Dysmetria with finger-to- nose-finger and heel-to-shin testing.  Finger and toe tapping is mildly slowed bilaterally.  Gait narrow based and stable. Unable to perform tandem gait due to unsteadiness.  He can walk on toes, but unable to heel walk.   Data: Labs 02/07/2014:  Ceruloplasmin 16, copper 64, zinc 84 Labs 12/27/2013: RPR NR, HIV NR  CSF 12/23/2013: W1 R0 G63 P53* Labs 12/02/2013: ANA neg, SPEP/UPEP with IFE no M protein, CRP <0.5,  ceruloplasmin 15**, copper 54**, celiac panel negative, c/p-ANCA neg Labs 11/27/2013: TSH 2.1, vitamin B12 453, ESR 1  EMG of the right upper and lower extremities 12/11/2013: 1. Chronic right L5 radiculopathy, mild in degree electrically. 2. Mildly prolonged right ulnar sensory response is of unclear clinical significance. Late responses are prolonged throughout and may be due to patient's height; alternatively, this can be seen in a demyelinating polyradiculoneuropathy, but is thought to be less likely in this patient. 3. In particular, there is no evidence of a generalized sensorimotor polyneuropathy affecting the right side.  MRI brain wo contrast 12/28/2013: stable and normal non-contrast MRI appearance of the brain.  MRI cervical spine wo contrast 12/28/2013: 1. New or progressed C5-C6 disc herniation to the left. Mild mass effect on  the thecal sac without significant spinal stenosis. Involvement of the proximal left C6 nerve/nerve roots is possible.  2. Evolution of the small left foraminal disc protrusion at C6-C7. Stable moderate left C7 foraminal stenosis.  3. Stable chronic mild right C4 foraminal stenosis.  MRI lumbar spine wwo contrast 12/01/2013: L2-3 right posterior lateral/foraminal/lateral protrusion with compression of the right lateral aspect of the thecal sac and contained nerve roots. Foraminal extension with mild impression upon and displacement of the exiting right L2 nerve root.  L4-5 mild bulge. Mild facet joint degenerative changes.  L5-S1 mild disc desiccation. L5 inferior endplate small Schmorl's node deformity with bony reactive changes. Bulge asymmetric greater to the right with contact with the upper S1 nerve root which does not appear compressed or displaced. Prominent right S1 nerve root sleeve.  MRI thoracic spine wwo contrast 12/01/2013: On the single sagittal sequence through the cervical spine obtained for proper level assignment, C5-6 disc protrusion and  mild cord flattening is noted (incompletely assessed on present exam) and appears to have progressed when compared to the 09/07/2011 cervical spine MR.   IMPRESSION/PLAN:   Mr. Kraeger is a 44 year-old gentleman presenting for follow-up of copper deficiency causing myeloneuropathy.  Symptoms started with paresthesias involving glove-stocking distribution and imbalance balance, with new complaints of speech, gross and fine motor movements.  He was lost to follow-up for almost one year.  Today, there is increased tone of the arms and legs, dysmetria, slowed speech, as well as sensory ataxia which was previously not seen. His exam shows relatively brisk and symmetric reflexes.  He has had extensive work-up including serology testing, CSF analysis, EMG, and imaging of the neuroaxis. Notable findings include: disc protrusion at C5-6, L2-3, and L5-S1 with mild cord flattening at C5-6; mild ulnar sensory prolonged distal latency and late responses; normal CSF testing; low copper and ceruloplasmin.  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  This would not explain his new complaints of facial paresthesias and speech changes, so I would like to repeat MRI brain wwo contrast to look for changes of his cerebellum.  Spinocerebellar ataxia is another consideration.  With his ongoing problems, I agree with second opinion at [facility]   Recheck ceruloplasmin and zinc.  Increase copper to $RemoveB'4mg'RXdPweUa$  daily.   The duration of this appointment visit was 40 minutes of face-to-face time with the patient.  Greater than 50% of this time was spent in counseling, explanation of diagnosis, planning of further management, and coordination of care.   Thank you for allowing me to participate in patient's care.  If I can answer any additional questions, I would be pleased to do so.    Sincerely,    Donika K. Posey Pronto, DO

## 2015-04-11 ENCOUNTER — Ambulatory Visit (HOSPITAL_BASED_OUTPATIENT_CLINIC_OR_DEPARTMENT_OTHER): Payer: 59

## 2015-04-12 LAB — ZINC: ZINC: 88 ug/dL (ref 60–130)

## 2015-04-14 ENCOUNTER — Telehealth: Payer: Self-pay | Admitting: Neurology

## 2015-04-14 ENCOUNTER — Encounter: Payer: Self-pay | Admitting: Neurology

## 2015-04-14 NOTE — Telephone Encounter (Signed)
See result note.  

## 2015-04-14 NOTE — Telephone Encounter (Signed)
PT called and said he was returning your call, thought it might have to do with blood work results/Dawn CB# [phone]

## 2015-04-18 ENCOUNTER — Ambulatory Visit (HOSPITAL_BASED_OUTPATIENT_CLINIC_OR_DEPARTMENT_OTHER): Payer: 59

## 2015-05-21 ENCOUNTER — Encounter: Payer: Self-pay | Admitting: Neurology

## 2015-05-22 ENCOUNTER — Other Ambulatory Visit: Payer: Self-pay | Admitting: *Deleted

## 2015-05-22 DIAGNOSIS — E610 Copper deficiency: Secondary | ICD-10-CM

## 2015-05-22 DIAGNOSIS — E619 Deficiency of nutrient element, unspecified: Secondary | ICD-10-CM

## 2015-05-22 DIAGNOSIS — K909 Intestinal malabsorption, unspecified: Secondary | ICD-10-CM

## 2015-06-11 ENCOUNTER — Telehealth: Payer: Self-pay | Admitting: Physician Assistant

## 2015-06-11 NOTE — Telephone Encounter (Signed)
Pt declines flu shot at this time. He will call if he decides to get it.

## 2015-07-08 ENCOUNTER — Telehealth: Payer: Self-pay | Admitting: Neurology

## 2015-07-08 NOTE — Telephone Encounter (Signed)
Called and set up appt with [facility] for 07/27/2015 with Dr Marina [facility]Perry[facility] Mychart message also sent.

## 2015-07-08 NOTE — Telephone Encounter (Signed)
Pt wants to check the status of the referral to a GI doctor please call [phone]

## 2015-07-20 ENCOUNTER — Encounter: Payer: Self-pay | Admitting: Neurology

## 2015-07-20 ENCOUNTER — Encounter: Payer: Self-pay | Admitting: Internal Medicine

## 2015-07-21 ENCOUNTER — Other Ambulatory Visit: Payer: Self-pay | Admitting: *Deleted

## 2015-07-21 ENCOUNTER — Telehealth: Payer: Self-pay | Admitting: Neurology

## 2015-07-21 DIAGNOSIS — E610 Copper deficiency: Secondary | ICD-10-CM

## 2015-07-21 NOTE — Telephone Encounter (Signed)
Let's check copper levels - please inform patient to come get orders.  Andora Krull K. Allena Katz, DO

## 2015-07-21 NOTE — Telephone Encounter (Signed)
Patient advise request sent to Dr. Allena Katz.

## 2015-07-21 NOTE — Telephone Encounter (Signed)
VM-PT left a message for a call back/Dawn CB# [phone]

## 2015-07-24 ENCOUNTER — Other Ambulatory Visit: Payer: Self-pay | Admitting: Neurology

## 2015-07-24 ENCOUNTER — Other Ambulatory Visit: Payer: Self-pay | Admitting: *Deleted

## 2015-07-24 ENCOUNTER — Telehealth: Payer: Self-pay

## 2015-07-24 DIAGNOSIS — E610 Copper deficiency: Secondary | ICD-10-CM

## 2015-07-24 NOTE — Telephone Encounter (Signed)
-----   Message from Hilarie Fredrickson, MD sent at 07/24/2015  2:05 PM EST ----- You can let him know that you are reviewing charts and could not see a reason for his visit. Confirming that he has a GI issue that needs evaluated. If it has anything to do with copper metabolism, we are not the clinic for that issue. Not sure who is. ----- Message -----    From: Jeanine Luz, CMA    Sent: 07/24/2015   1:46 PM      To: Hilarie Fredrickson, MD  I was preparing charts for Monday and noticed several patient emails that make me question if you would want him seen.  On 11/17 he asked Dr. 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  For some reason, on 1/16 he emailed you a question and your response was that you did not treat issues with copper metabolism.  That appears to basically be what he is coming in although perhaps he has some gi symptoms, but none are mentioned.  What would you like me to do?  Thanks

## 2015-07-27 ENCOUNTER — Ambulatory Visit: Payer: 59 | Admitting: Internal Medicine

## 2015-07-27 LAB — COPPER, SERUM: Copper: 59 ug/dL — ABNORMAL LOW (ref 70–175)

## 2015-08-15 ENCOUNTER — Encounter: Payer: Self-pay | Admitting: Neurology

## 2015-08-25 ENCOUNTER — Encounter: Payer: Self-pay | Admitting: *Deleted

## 2015-09-28 ENCOUNTER — Encounter: Payer: Self-pay | Admitting: Physician Assistant

## 2015-09-28 ENCOUNTER — Encounter: Payer: Self-pay | Admitting: Neurology

## 2015-09-29 ENCOUNTER — Other Ambulatory Visit: Payer: Self-pay | Admitting: *Deleted

## 2015-09-29 DIAGNOSIS — E538 Deficiency of other specified B group vitamins: Secondary | ICD-10-CM

## 2015-09-29 DIAGNOSIS — R2681 Unsteadiness on feet: Secondary | ICD-10-CM

## 2015-09-29 DIAGNOSIS — G629 Polyneuropathy, unspecified: Secondary | ICD-10-CM

## 2015-09-29 DIAGNOSIS — R200 Anesthesia of skin: Secondary | ICD-10-CM

## 2015-09-29 DIAGNOSIS — G320 Subacute combined degeneration of spinal cord in diseases classified elsewhere: Principal | ICD-10-CM

## 2015-09-29 DIAGNOSIS — R278 Other lack of coordination: Secondary | ICD-10-CM

## 2015-09-29 DIAGNOSIS — E610 Copper deficiency: Secondary | ICD-10-CM

## 2015-10-02 ENCOUNTER — Other Ambulatory Visit (INDEPENDENT_AMBULATORY_CARE_PROVIDER_SITE_OTHER): Payer: 59

## 2015-10-02 DIAGNOSIS — R278 Other lack of coordination: Secondary | ICD-10-CM

## 2015-10-02 DIAGNOSIS — E538 Deficiency of other specified B group vitamins: Secondary | ICD-10-CM

## 2015-10-02 DIAGNOSIS — R200 Anesthesia of skin: Secondary | ICD-10-CM

## 2015-10-02 DIAGNOSIS — E610 Copper deficiency: Secondary | ICD-10-CM

## 2015-10-02 DIAGNOSIS — G320 Subacute combined degeneration of spinal cord in diseases classified elsewhere: Secondary | ICD-10-CM

## 2015-10-02 DIAGNOSIS — G629 Polyneuropathy, unspecified: Secondary | ICD-10-CM

## 2015-10-02 DIAGNOSIS — R2681 Unsteadiness on feet: Secondary | ICD-10-CM

## 2015-10-05 LAB — CERULOPLASMIN: Ceruloplasmin: 17 mg/dL — ABNORMAL LOW (ref 18–36)

## 2015-10-06 ENCOUNTER — Encounter: Payer: Self-pay | Admitting: *Deleted

## 2015-10-06 LAB — COPPER, SERUM: Copper: 63 ug/dL — ABNORMAL LOW (ref 70–175)

## 2015-10-15 ENCOUNTER — Encounter: Payer: Self-pay | Admitting: Neurology

## 2015-11-01 ENCOUNTER — Encounter: Payer: Self-pay | Admitting: Neurology

## 2015-11-05 ENCOUNTER — Encounter: Payer: Self-pay | Admitting: Physician Assistant

## 2015-11-05 ENCOUNTER — Encounter: Payer: Self-pay | Admitting: Neurology

## 2015-11-11 ENCOUNTER — Telehealth: Payer: Self-pay | Admitting: Physician Assistant

## 2015-11-11 NOTE — Telephone Encounter (Signed)
Spoke with patient and informed him that he can pick-up the container at our office [front desk], but his results be be forwarded to the provider that placed the order and he will need to take the completed test to the provider that placed the order; understood and agreed/SLS 05/10

## 2015-11-11 NOTE — Telephone Encounter (Signed)
Can be reached: [phone]   Reason for call: Pt called stating he has order for 24 hour urine collection from Dr. Ezequiel EssexGable. He is wanting to come here and get the container and I think he is wanting us to result??? I'm not sure. Pt is requesting call back.

## 2016-03-16 ENCOUNTER — Ambulatory Visit: Payer: 59 | Admitting: Physical Therapy

## 2016-03-18 ENCOUNTER — Ambulatory Visit: Payer: 59 | Attending: Neurology

## 2016-03-18 DIAGNOSIS — R42 Dizziness and giddiness: Secondary | ICD-10-CM | POA: Insufficient documentation

## 2016-03-18 DIAGNOSIS — R278 Other lack of coordination: Secondary | ICD-10-CM | POA: Diagnosis present

## 2016-03-18 DIAGNOSIS — R260 Ataxic gait: Secondary | ICD-10-CM | POA: Diagnosis not present

## 2016-03-18 DIAGNOSIS — R2689 Other abnormalities of gait and mobility: Secondary | ICD-10-CM | POA: Diagnosis present

## 2016-03-18 DIAGNOSIS — M6281 Muscle weakness (generalized): Secondary | ICD-10-CM | POA: Diagnosis present

## 2016-03-18 DIAGNOSIS — R2681 Unsteadiness on feet: Secondary | ICD-10-CM | POA: Insufficient documentation

## 2016-03-18 NOTE — Therapy (Signed)
[facility] [address] [place], [place], [address] Phone: [phone]   Fax:  [phone]  Physical Therapy Evaluation  Patient Details  Name: Travis [name] MRN: [id number] Date of Birth: 1971-01-25 Referring Provider: Dr. Ezequiel Essex  Encounter Date: 03/18/2016      PT End of Session - 03/18/16 1546    Visit Number 1   Number of Visits 17   Date for PT Re-Evaluation 05/17/16   Authorization Type UHC-60 visit combined visit limit   PT Start Time 1441   PT Stop Time 1526   PT Time Calculation (min) 45 min   Equipment Utilized During Treatment Gait belt   Activity Tolerance Patient tolerated treatment well   Behavior During Therapy [facility] for tasks assessed/performed      Past Medical History:  Diagnosis Date  . History of chicken pox    childhood  . Hyperlipidemia     Past Surgical History:  Procedure Laterality Date  . NO PAST SURGERIES  07/08/2011    There were no vitals filed for this visit.       Subjective Assessment - 03/18/16 1448    Subjective Pt reported symptoms began with N/T a few years ago. Sx's progressed to weight loss, imbalance, falling off mountain bike, dysarthria, and dizziness. Pt reports dizziness is always there but nausea is getting better. Pt states meclizine did not help dizziness, so he ceased medication. Pt reports N/T is in hands and starts in the knees and travels distally to feet.  Pt reports he becomes fatigued quickly, especially after performing yardwork. Pt reports ataxia is worse later in the day. Pt has an appt. 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 Pt reports stairs, turns, and ramps are difficult. Pt reports dizziness is worse during turns, looking up/down, and closing eyes in the shower (holds onto shower). Pt states turns provoke spinning dizziness and turning head up/down provokes  wooziness.   Pertinent History none prior to N/T, parasthesias, ataxia   Patient Stated Goals Improve balance, and keep me out of a w/c.    Currently in Pain? No/denies            [facility] PT Assessment - 03/18/16 1453      Assessment   Medical Diagnosis Gait, ataxia (per pt chart)   Referring Provider Dr. Ezequiel Essex   Onset Date/Surgical Date 03/18/14   Hand Dominance Right   Prior Therapy none     Precautions   Precautions Fall   Precaution Comments based on hx and DGI score     Restrictions   Weight Bearing Restrictions No     Balance Screen   Has the patient fallen in the past 6 months Yes  pt has managed to catch himself   How many times? --  weekly over the last 6 months, but pt catches himself   Has the patient had a decrease in activity level because of a fear of falling?  Yes   Is the patient reluctant to leave their home because of a fear of falling?  No  pt has difficulty traversing ramps     Home Environment   Living Environment Private residence   Living Arrangements Spouse/significant other;Children  wife and 2 kids, 14 y/o and 8 y/o   Available Help at Discharge Family   Type of Home House   Home Access Stairs to enter   Entrance Stairs-Number of Steps 5  front steps, 3 out back and no rails   Entrance Stairs-Rails Can reach both   Home Layout Two level;Able to live on main level with bedroom/bathroom   Alternate Level Stairs-Number of Steps 12   Alternate Level Stairs-Rails Left   Home Equipment Cane - single point     Prior Function   Level of Independence Independent   Vocation Full time employment   Medical illustrator: Sitting down, walking around, typing, writing   Leisure [employer], hike/fishing with kids     Cognition   Overall Cognitive Status Within Functional Limits for tasks assessed     Observation/Other Assessments   Focus on Therapeutic Outcomes (FOTO)  ABC: 26.3%, scores closer to 100% represent increased confidence  in balance.     Sensation   Light Touch Impaired by gross assessment  Decr. light food, and starting at knees distal to feet   Additional Comments Pt reported N/T in hands and BLEs (knee distal to feet)     Coordination   Gross Motor Movements are Fluid and Coordinated No   Fine Motor Movements are Fluid and Coordinated No   Finger Nose Finger Test Dysmetria during B chin to finger    Heel Shin Test Ataxia noted during B testing     Posture/Postural Control   Posture/Postural Control No significant limitations     ROM / Strength   AROM / PROM / Strength AROM;Strength     AROM   Overall AROM  Within functional limits for tasks performed   Overall AROM Comments B UE/LE AROM WFL     Strength   Overall Strength Deficits   Overall Strength Comments BLE: hip flex: 4/5, knee ext: 4/5, knee flex: 4-/5, ankle DF: 4/5. Seated hip abd: 4/5, hip add: 3+/5. Ataxia noted during MMT.     Transfers   Transfers Sit to Stand;Stand to Sit   Sit to Stand 5: Supervision;With upper extremity assist;From chair/3-in-1   Sit to Stand Details (indicate cue type and reason) Pt reported he needs to use B UE support to perform sit to stand.   Stand to Sit 5: Supervision;With upper extremity assist;To chair/3-in-1   Stand to Sit Details Decr. eccentric control during last 6" of descent.     Ambulation/Gait   Ambulation/Gait Yes   Ambulation/Gait Assistance 4: Min guard;5: Supervision   Ambulation/Gait Assistance Details Pt noted to experience dizziness during 180 degree turns.  Pt noted to emphasize B heel strike, in order to feel ground/foot placement.   Ambulation Distance (Feet) 300 Feet   Assistive device None   Gait Pattern Step-through pattern;Decreased stride length;Decreased trunk rotation  decr. eccentric control during swing phase   Ambulation Surface Level;Indoor   Gait velocity 3.22ft/sec.     Standardized Balance Assessment   Standardized Balance Assessment Dynamic Gait Index;Timed Up  and Go Test     Dynamic Gait Index   Level Surface Moderate Impairment   Change in Gait Speed Mild Impairment   Gait with Horizontal Head Turns Moderate Impairment   Gait with Vertical Head Turns Moderate Impairment   Gait and Pivot Turn Moderate Impairment   Step Over Obstacle Mild Impairment   Step Around Obstacles Mild Impairment   Steps Mild Impairment   Total Score 12     Timed Up and Go Test   TUG Normal TUG   Normal TUG (seconds) 12.5  no AD  PT Education - 03/18/16 1545    Education provided Yes   Education Details PT discussed frequency/duration and outcome measure results. PT discussed the benefits of a referral for an OT eval, as pt has difficulty with writing, typing, and hand dexterity.  PT discussed that therapy will focus on maintaining strength and improving balance/gait safety, and on compensations/adaptations to ensure safety.  Pt stated he went to the pool but had difficulty swimming 2/2 decr. coordination, PT reiterated the importance of performing exercises in shallow end of pool, with someone present for safety and not swimming.  PT also informed pt that his family is welcome to come to therapy appt's.    Person(s) Educated Patient   Methods Explanation   Comprehension Verbalized understanding          PT Short Term Goals - 03/18/16 1552      PT SHORT TERM GOAL #1   Title Pt will be IND in HEP to improve balance, coordination, reduce dizzienss, and improve/maintain strength. TARGET DATE FOR ALL STGS: 04/15/16   Status New     PT SHORT TERM GOAL #2   Title Pt will improve DGI score to >/=14/24 to decr. falls risk.   Status New     PT SHORT TERM GOAL #3   Title Pt will amb. 300' over even terrain with LRAD at MOD I level to improve functional mobility.    Status New     PT SHORT TERM GOAL #4   Title Pt will perform STS x10 reps without use of UE support to improve functional mobility.    Status New     PT  SHORT TERM GOAL #5   Title Pt will traverse 12 steps, in step through pattern, at MOD I level to safely traverse steps at home.   Status New           PT Long Term Goals - 03/18/16 1554      PT LONG TERM GOAL #1   Title Pt will amb. 600' over even/uneven terrain with LRAD in order to improve functional mobility. TARGET DATE FOR ALL LTGS: 05/13/16   Status New     PT LONG TERM GOAL #2   Title Pt will perform 180 degree turns and head turns/nods with dizziness not incr. > 2 points in order to improve safety during functional mobility.    Status New     PT LONG TERM GOAL #3   Title Pt will improve DGI score to >/=16/24 to decr. falls risk.    Status New     PT LONG TERM GOAL #4   Title Pt will traverse ramps (incline/decline) with LRAD at MOD I level to safely amb. in the community.   Status New     PT LONG TERM GOAL #5   Title Pt will verbalize understanding of fall prevention techniques to reduce risk of falls.    Status New     Additional Long Term Goals   Additional Long Term Goals Yes     PT LONG TERM GOAL #6   Title Pt will improve ABC score from 26.3% to 41% to improve confidence in balance during functional activities.   Status New               Plan - 03/18/16 1546    Clinical Impression Statement Pt is a pleasant 45y/o male presenting to OPPT neuro with gait imbalance and ataxia. Pt's PMH not significant prior to neurological sx's which began approx. 2 years ago. 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 Pt has an app[place]. [place]11/[place] in [place] [facility]. Pt presented with gait deviations, ataxia (impaired coordination), impaired strength, dizziness, decr. endurance , impaired balance, and impaired sensation. Pt's DGI score indicates pt is at risk for falls. Pt would benefit from an OT eval to address difficulty with grip, typing and  writing. Pt would benefit from a speech eval to address dysarthria.    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 Provide fall prevention ed.   Recommended Other Services OT and Speech consults   Consulted and Agree with Plan of Care Patient      Patient will benefit from skilled therapeutic intervention in order to improve the following deficits and impairments:  Abnormal gait, Decreased endurance, Impaired sensation, Decreased balance, Decreased mobility, Dizziness, Decreased coordination, Impaired flexibility, Decreased strength, Other (comment)  Visit Diagnosis: Ataxic gait - Plan: PT plan of care cert/re-cert  Other abnormalities of gait and mobility - Plan: PT plan of care cert/re-cert  Dizziness and giddiness - Plan: PT plan of care cert/re-cert  Muscle weakness (generalized) - Plan: PT plan of care cert/re-cert     Problem List Patient Active Problem List   Diagnosis Date Noted  . Dizziness 03/29/2015  . 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 Visit for preventive health examination 11/27/2013  . Erectile dysfunction 11/27/2013  . Paresthesia 07/30/2011  . Weight loss, abnormal 07/10/2011  . Neuropathy (HCC) 07/10/2011  . Sciatica of right side 11/22/2010  . PYELONEPHRITIS, ACUTE 07/27/2010  .  HYPERLIPIDEMIA 04/20/2006    Travis [name],Travis [name] 03/18/2016, 4:09 PM  [facility] [address] [place], [place], [address] Phone: [phone]   Fax:  [phone]  Name: Travis [name] MRN: [id number] Date of Birth: 02-Aug-1970  Travis BoersJennifer Travis [name], PT,DPT 03/18/16 4:10 PM Phone: [phone] Fax: [phone]

## 2016-03-23 ENCOUNTER — Ambulatory Visit: Payer: 59 | Admitting: Physical Therapy

## 2016-03-23 ENCOUNTER — Encounter: Payer: Self-pay | Admitting: Physical Therapy

## 2016-03-23 DIAGNOSIS — M6281 Muscle weakness (generalized): Secondary | ICD-10-CM

## 2016-03-23 DIAGNOSIS — R42 Dizziness and giddiness: Secondary | ICD-10-CM

## 2016-03-23 DIAGNOSIS — R2689 Other abnormalities of gait and mobility: Secondary | ICD-10-CM

## 2016-03-23 DIAGNOSIS — R260 Ataxic gait: Secondary | ICD-10-CM

## 2016-03-23 NOTE — Patient Instructions (Addendum)
At counter or dining table to support/balance as needed with the following:  "I love a Parade" Lift    High knee marching forward and then backwards.  Repeat 3 laps each way. Do _1-2_ sessions per day.  http://gt2.exer.us/344   Copyright  VHI. All rights reserved.  Feet Heel-Toe "Tandem"    Arms as needed for balance, walk a straight line forward bringing one foot directly in front of the other and then walk a straight line backward bringing one foot directly behind the other one. Repeat 3 laps each way. Do _1-2__ sessions per day.  Copyright  VHI. All rights reserved.  Walking on Toes    Walk on toes forward while continuing on a straight path and then backwards to starting point.  Perform 3 laps each way. Do __1-2_ sessions per day.  Copyright  VHI. All rights reserved.

## 2016-03-25 ENCOUNTER — Ambulatory Visit: Payer: 59 | Admitting: Physical Therapy

## 2016-03-25 DIAGNOSIS — R42 Dizziness and giddiness: Secondary | ICD-10-CM

## 2016-03-25 DIAGNOSIS — R2689 Other abnormalities of gait and mobility: Secondary | ICD-10-CM

## 2016-03-25 DIAGNOSIS — R260 Ataxic gait: Secondary | ICD-10-CM | POA: Diagnosis not present

## 2016-03-25 NOTE — Therapy (Signed)
[facility] [address] [place], [place], [address] Phone: [phone]   Fax:  [phone]  Physical Therapy Treatment  Patient Details  Name: Travis [name] MRN: [id number] Date of Birth: 09-21-1970 Referring Provider: Dr. Ezequiel EssexGable  Encounter Date: 03/25/2016      PT End of Session - 03/25/16 1632    Visit Number 3   Number of Visits 17   Date for PT Re-Evaluation 05/17/16   Authorization Type UHC-60 visit combined visit limit   PT Start Time 0848   PT Stop Time 0932   PT Time Calculation (min) 44 min      Past Medical History:  Diagnosis Date  . History of chicken pox    childhood  . Hyperlipidemia     Past Surgical History:  Procedure Laterality Date  . NO PAST SURGERIES  07/08/2011    There were no vitals filed for this visit.      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    Currently in Pain? No/denies                         [facility] Adult PT Treatment/Exercise - 03/25/16 0904      Transfers   Transfers Sit to Stand;Stand to Sit   Sit to Stand 5: Supervision;With upper extremity assist;From chair/3-in-1   Sit to Stand Details (indicate cue type and reason) needs to stabilize upon initial sit to stand - stands with wide BOS   Stand to Sit 5: Supervision;With upper extremity assist;To chair/3-in-1   Stand to Sit Details UE's used for controlled descent     Ambulation/Gait   Ambulation/Gait Assistance 5: Supervision   Ambulation/Gait Assistance Details L foot appears to be more externally rotated than RLE; walking pole used for assistance with amb. (1 pole used in R  hand)   Ambulation Distance (Feet) 240 Feet   Assistive device Other (Comment)  1 walking pole used (RUE)   Gait Pattern Step-through pattern;Ataxic   Ambulation Surface Level;Indoor   Stairs Yes   Stairs Assistance 5: Supervision   Stair Management Technique One rail Right;Alternating pattern   Number of Stairs 4   Height of Stairs 6     NeuroRe-ed: positional testing for vertigo - sit to R sidelying= no nystagmus noted - pt reported some dizziness with moving toward R sidelying from seated position but states it is short in duration;  Sit to L sidelying  - not as much dizziness reported with this movement - no nystagmus noted in  L sidelying position R and L Dix-Hallpike tests (-) for nystagmus and c/o vertigo in test positions  Smooth pursuits - ? Abnormality noted initially, but resolved after several reps of testing  Dyamic visual acuity test attempted  - unable to accurately assess due to pt wearing progressive lenses - pt states he will wear contacts next session  for this assessment   Pt performed standing on grey foam - in corner - bil. LE's tremoring significantly; stopped this exercise and changed to standing of floor (gave for HEP)   Quadriped position - lifting opposite UE  and LE - 2 times each side- 3 sec hold attempted - gave this ex for HEP      Balance Exercises - 03/25/16 1627      Balance Exercises: Standing   Standing Eyes Opened Wide (BOA);Head turns;Foam/compliant surface   Standing Eyes Closed Wide (BOA);Head turns;Solid surface             PT Short Term Goals - 03/18/16 1552      PT SHORT TERM GOAL #1   Title Pt will be IND in HEP to improve balance, coordination, reduce dizzienss, and improve/maintain strength. TARGET DATE FOR ALL STGS: 04/15/16   Status New     PT SHORT TERM GOAL #2   Title Pt will improve DGI score to >/=14/24 to decr. falls risk.   Status New     PT SHORT TERM GOAL #3   Title Pt will amb. 300' over even terrain with  LRAD at MOD I level to improve functional mobility.    Status New     PT SHORT TERM GOAL #4   Title Pt will perform STS x10 reps without use of UE support to improve functional mobility.    Status New     PT SHORT TERM GOAL #5   Title Pt will traverse 12 steps, in step through pattern, at MOD I level to safely traverse steps at home.   Status New           PT Long Term Goals - 03/18/16 1554      PT LONG TERM GOAL #1   Title Pt will amb. 600' over even/uneven terrain with LRAD in order to improve functional mobility. TARGET DATE FOR ALL LTGS: 05/13/16   Status New     PT LONG TERM GOAL #2   Title Pt will perform 180 degree turns and head turns/nods with dizziness not incr. > 2 points in order to improve safety during functional mobility.    Status New     PT LONG TERM GOAL #3   Title Pt will improve DGI score to >/=16/24 to decr. falls risk.    Status New     PT LONG TERM GOAL #4   Title Pt will traverse ramps (incline/decline) with LRAD at MOD I level to safely amb. in the community.   Status New     PT LONG TERM GOAL #5   Title Pt will verbalize understanding of fall prevention techniques to reduce risk of falls.    Status New     Additional Long Term Goals   Additional Long Term Goals Yes     PT LONG TERM GOAL #6   Title Pt will improve ABC score from 26.3% to 41% to improve confidence in balance during functional activities.   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   PT Home Exercise Plan added standing on floor with EC and quadriped ex for core stabilization   Consulted and Agree with Plan of Care Patient      Patient will benefit from skilled therapeutic intervention in order to improve the following deficits and impairments:  Abnormal gait, Decreased endurance, Impaired sensation, Decreased balance, Decreased mobility, Dizziness, Decreased coordination, Impaired flexibility, Decreased strength, Other (comment)  Visit Diagnosis: Other abnormalities of gait and mobility  Dizziness and giddiness     Problem List Patient Active Problem List   Diagnosis Date Noted  . Dizziness 03/29/2015  . 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 Visit for preventive health examination 11/27/2013  . Erectile dysfunction 11/27/2013  . Paresthesia 07/30/2011  . Weight loss, abnormal 07/10/2011  . Neuropathy (HCC) 07/10/2011  . Sciatica of right side 11/22/2010  . PYELONEPHRITIS, ACUTE 07/27/2010  . HYPERLIPIDEMIA 04/20/2006    Kary Kos, PT 03/25/2016, 4:52 PM  [facility] [address] [place], [place], [address] Phone: [phone]   Fax:  [phone]  Name: Travis [name] MRN: [id number] Date of Birth: 12/19/1970

## 2016-03-25 NOTE — Therapy (Signed)
[name] [address] [place], [place], [address] Phone: [phone]   Fax:  [phone]  Physical Therapy Treatment  Patient Details  Name: Travis [name] MRN: [id number] Date of Birth: 1970-08-06 Referring Provider: Dr. Ezequiel EssexGable  Encounter Date: 03/23/2016   03/23/16 1325  PT Visits / Re-Eval  Visit Number 2  Number of Visits 17  Date for PT Re-Evaluation 05/17/16  Authorization  Authorization Type UHC-60 visit combined visit limit  PT Time Calculation  PT Start Time 1319  PT Stop Time 1400  PT Time Calculation (min) 41 min  PT - End of Session  Equipment Utilized During Treatment Gait belt  Activity Tolerance Patient tolerated treatment well  Behavior [facility] Therapy [facility] for tasks assessed/performed     Past Medical History:  Diagnosis Date  . History of chicken pox    childhood  . Hyperlipidemia     Past Surgical History:  Procedure Laterality Date  . NO PAST SURGERIES  07/08/2011    There were no vitals filed for this visit.     03/23/16 1324  Symptoms/Limitations  Subjective No new complaints. No falls to report.   Pertinent History none prior to N/T, parasthesias, ataxia  Patient Stated Goals Improve balance, and keep me out of a w/c.   Pain Assessment  Currently in Pain? No/denies      03/23/16 1343  Ambulation/Gait  Ambulation/Gait Yes  Ambulation/Gait Assistance 4: Min guard  Ambulation/Gait Assistance Details pt continues to have bil LE ataxic movement patterns with either device, however appears more stable with use of forearm crutches. may benefit from ankle wieghts to assist with decreased ataxic movements.                      Ambulation Distance (Feet) 445 Feet (x1, 375 x1 loftstrands)  Assistive device Other (Comment);Lofstrands (bil walking sticks)  Gait Pattern Step-through pattern;Decreased stride length;Decreased trunk rotation;Ataxic  Ambulation Surface Level;Indoor    Issued the following to pt's HEP: At counter or dining table to support/balance as needed with the following:  "I love a Parade" Lift    High knee marching forward and then backwards.  Repeat 3 laps each way. Do _1-2_ sessions per day.  http://gt2.exer.us/344   Copyright  VHI. All rights reserved.  Feet Heel-Toe "Tandem"    Arms as needed for balance, walk a straight line forward bringing one foot directly in front of the other and then walk a straight line backward bringing one foot directly behind the other one. Repeat 3 laps each way. Do _1-2__ sessions per day.  Copyright  VHI. All rights reserved.  Walking on Toes    Walk on toes forward while continuing on a straight path and then backwards to starting point.  Perform 3 laps each way. Do __1-2_ sessions per day.  Copyright  VHI. All rights reserved.          PT Short Term Goals - 03/18/16 1552      PT SHORT TERM GOAL #1   Title Pt will be IND in HEP to improve balance, coordination, reduce dizzienss, and improve/maintain strength. TARGET DATE FOR ALL STGS: 04/15/16   Status New     PT SHORT TERM GOAL #2   Title Pt will improve DGI score to >/=14/24 to decr. falls risk.   Status New     PT SHORT TERM GOAL #3   Title Pt will amb. 300' over even terrain with LRAD at MOD I level to  improve functional mobility.    Status New     PT SHORT TERM GOAL #4   Title Pt will perform STS x10 reps without use of UE support to improve functional mobility.    Status New     PT SHORT TERM GOAL #5   Title Pt will traverse 12 steps, in step through pattern, at MOD I level to safely traverse steps at home.   Status New           PT Long Term Goals - 03/18/16 1554      PT LONG TERM GOAL #1   Title Pt will amb. 600' over even/uneven terrain with LRAD in order to improve functional mobility. TARGET DATE FOR ALL LTGS: 05/13/16   Status New     PT LONG TERM GOAL #2   Title Pt will perform 180 degree turns and  head turns/nods with dizziness not incr. > 2 points in order to improve safety during functional mobility.    Status New     PT LONG TERM GOAL #3   Title Pt will improve DGI score to >/=16/24 to decr. falls risk.    Status New     PT LONG TERM GOAL #4   Title Pt will traverse ramps (incline/decline) with LRAD at MOD I level to safely amb. in the community.   Status New     PT LONG TERM GOAL #5   Title Pt will verbalize understanding of fall prevention techniques to reduce risk of falls.    Status New     Additional Long Term Goals   Additional Long Term Goals Yes     PT LONG TERM GOAL #6   Title Pt will improve ABC score from 26.3% to 41% to improve confidence in balance during functional activities.   Status New        03/23/16 1325  Plan  Clinical Impression Statement Today's schedule focused on establishement of HEP for balance and gait with various devices. Pt continues to have ataxic movements with use of either one used today. Possibly try weighted LE"s with gait next session. Pt also needs vestibular screening next session.  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 Provide fall prevention ed.  Consulted and Agree with Plan of Care Patient          Patient will benefit from skilled therapeutic intervention in order to improve the following deficits and impairments:  Abnormal gait, Decreased endurance, Impaired sensation, Decreased balance, Decreased mobility, Dizziness, Decreased coordination, Impaired flexibility, Decreased strength, Other (comment)  Visit Diagnosis: Ataxic gait  Other abnormalities of gait and mobility  Dizziness and giddiness  Muscle weakness (generalized)     Problem List Patient Active Problem List   Diagnosis Date Noted  . Dizziness 03/29/2015  . 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 Visit for preventive health examination 11/27/2013  . Erectile dysfunction 11/27/2013  . Paresthesia 07/30/2011  . Weight loss, abnormal 07/10/2011  . Neuropathy (HCC)  07/10/2011  . Sciatica of right side 11/22/2010  . PYELONEPHRITIS, ACUTE 07/27/2010  . HYPERLIPIDEMIA 04/20/2006   Travis [name], Travis [name], Travis [name] [address] [place], [place] [address] [phone] 03/25/16, 8:49 AM   Name: Travis [name] MRN: [id number] Date of Birth: [birth year]

## 2016-03-25 NOTE — Patient Instructions (Addendum)
Feet Apart (Compliant Surface) Arm Motion - Eyes Closed    Stand on compliant surface: ___FLOOR_____, feet shoulder width apart. Close eyes and move arms up and down: to front. Repeat __2__ times per session. Do _3___ sessions per day. HOLD 10 secs (NOT ON CUSHION)   GET on HANDS AND KNEES - LIFT OPPOSITE ARM AND LEG - TRY TO HOLD 3-5 secs; both sides Copyright  VHI. All rights reserved.

## 2016-03-29 ENCOUNTER — Ambulatory Visit: Payer: 59 | Admitting: Occupational Therapy

## 2016-03-29 ENCOUNTER — Ambulatory Visit: Payer: 59

## 2016-03-29 ENCOUNTER — Telehealth: Payer: Self-pay

## 2016-03-29 DIAGNOSIS — R260 Ataxic gait: Secondary | ICD-10-CM | POA: Diagnosis not present

## 2016-03-29 DIAGNOSIS — Z7409 Other reduced mobility: Secondary | ICD-10-CM

## 2016-03-29 DIAGNOSIS — R2689 Other abnormalities of gait and mobility: Secondary | ICD-10-CM

## 2016-03-29 DIAGNOSIS — R42 Dizziness and giddiness: Secondary | ICD-10-CM

## 2016-03-29 DIAGNOSIS — M6281 Muscle weakness (generalized): Secondary | ICD-10-CM

## 2016-03-29 DIAGNOSIS — Z789 Other specified health status: Secondary | ICD-10-CM

## 2016-03-29 NOTE — Telephone Encounter (Signed)
Thank you for the message. I agree that he would benefit from OT giving health conditions. I have placed the referral.

## 2016-03-29 NOTE — Addendum Note (Signed)
Addended by: Marcelline MatesMARTIN, Tysheem Accardo on: 03/29/2016 12:40 PM   Modules accepted: Orders

## 2016-03-29 NOTE — Therapy (Signed)
[facility] [address] [place], [place], [address] Phone: [phone]   Fax:  [phone]  Physical Therapy Treatment  Patient Details  Name: Travis [name] MRN: [id number] Date of Birth: Mar 13, 1971 Referring Provider: Dr. Ezequiel EssexGable  Encounter Date: 03/29/2016      PT End of Session - 03/29/16 0927    Visit Number 4   Number of Visits 17   Date for PT Re-Evaluation 05/17/16   Authorization Type UHC-60 visit combined visit limit   PT Start Time 0806  pt arrived late   PT Stop Time 0844   PT Time Calculation (min) 38 min   Activity Tolerance Patient tolerated treatment well   Behavior During Therapy [facility] for tasks assessed/performed      Past Medical History:  Diagnosis Date  . History of chicken pox    childhood  . Hyperlipidemia     Past Surgical History:  Procedure Laterality Date  . NO PAST SURGERIES  07/08/2011    There were no vitals filed for this visit.      Subjective Assessment - 03/29/16 0808    Subjective Pt reported he overdid it on Saturday, while performing yardwork, and he feel like he "tweeked" his back. Pt also feels like he takes steps "hard", he doesn't always bend his knees. Pt reported dizziness is still an issue.   Pertinent History none prior to N/T, parasthesias, ataxia   Patient Stated Goals Improve balance, and keep me out of a w/c.    Currently in Pain? No/denies                Vestibular Assessment - 03/29/16 0833      Symptom Behavior   Type of Dizziness Spinning   Frequency of Dizziness Every day   Duration of Dizziness Spinning lasts approx. 10 sec., then feels woozy/nauseated afterwards. pt reports at worst 8-9/10 and 0/10 at best   Aggravating Factors Sit to stand;Looking up to the ceiling;Turning head quickly  looking down, and closing eyes in shower   Relieving Factors Rest;Head stationary     Occulomotor Exam   Occulomotor Alignment Normal   Spontaneous  Absent   Gaze-induced Absent   Smooth Pursuits Intact   Saccades Intact   Comment Pt reported slight dizziness during smooth pursuits.     Vestibulo-Occular Reflex   VOR 1 Head Only (x 1 viewing) Very slow speed, as pt reported dizziness incr. with only minimal head movement.    Comment B head thrust test negative for saccades or dizziness.         Therex: Pt performed functional strengthening with cues and S for safety and technique. Please see pt instructions for details.  PT also educated pt on rolling to the side, then performing sidelying to sit txf vs. Supine to sit to reduce strain on back.            Self Care:     PT Education - 03/29/16 0926    Education provided Yes   Education Details Fall prevention handout provided to pt and reviewed with pt. PT provided pt with functional strengthening HEP. PT discussed vestibular assessment result and requested pt wear contacts next session. PT notified pt that we don't have OT order yet but will request OT consult from MD.    Person(s) Educated Patient   Methods Explanation;Demonstration;Verbal cues;Handout   Comprehension Returned demonstration;Verbalized understanding          PT Short Term Goals - 03/18/16 1552  PT SHORT TERM GOAL #1   Title Pt will be IND in HEP to improve balance, coordination, reduce dizzienss, and improve/maintain strength. TARGET DATE FOR ALL STGS: 04/15/16   Status New     PT SHORT TERM GOAL #2   Title Pt will improve DGI score to >/=14/24 to decr. falls risk.   Status New     PT SHORT TERM GOAL #3   Title Pt will amb. 300' over even terrain with LRAD at MOD I level to improve functional mobility.    Status New     PT SHORT TERM GOAL #4   Title Pt will perform STS x10 reps without use of UE support to improve functional mobility.    Status New     PT SHORT TERM GOAL #5   Title Pt will traverse 12 steps, in step through pattern, at MOD I level to safely traverse steps at home.    Status New           PT Long Term Goals - 03/18/16 1554      PT LONG TERM GOAL #1   Title Pt will amb. 600' over even/uneven terrain with LRAD in order to improve functional mobility. TARGET DATE FOR ALL LTGS: 05/13/16   Status New     PT LONG TERM GOAL #2   Title Pt will perform 180 degree turns and head turns/nods with dizziness not incr. > 2 points in order to improve safety during functional mobility.    Status New     PT LONG TERM GOAL #3   Title Pt will improve DGI score to >/=16/24 to decr. falls risk.    Status New     PT LONG TERM GOAL #4   Title Pt will traverse ramps (incline/decline) with LRAD at MOD I level to safely amb. in the community.   Status New     PT LONG TERM GOAL #5   Title Pt will verbalize understanding of fall prevention techniques to reduce risk of falls.    Status New     Additional Long Term Goals   Additional Long Term Goals Yes     PT LONG TERM GOAL #6   Title Pt will improve ABC score from 26.3% to 41% to improve confidence in balance during functional activities.   Status New               Plan - 03/29/16 1610    Clinical Impression Statement Pt continues experience dizziness which appears to be central in etiology. Pt forgot to wear contacts today, therefore, PT requested pt wear contacts next session in order to perform DVA. Strengthening HEP focused on functional mobility and PT reiterated the importance of taking rest breaks to reduce fatigue. Continue with POC.   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   PT Frequency 2x / week   PT Duration 8 weeks   PT Treatment/Interventions Energy conservation;Vestibular;Patient/family education;Orthotic Fit/Training;Wheelchair mobility training;Manual techniques;Neuromuscular re-education;Balance training;Therapeutic  exercise;Therapeutic activities;Functional mobility training;Stair training;Gait training;DME Instruction;ADLs/Self Care Home Management;Biofeedback;Canalith Repostioning;Electrical Stimulation   PT Next Visit Plan check DVA (pt to wear his contacts) :cont gait and balance training   PT Home Exercise Plan added standing on floor with EC and quadriped ex for core stabilization and functional strengthening HEP   Consulted and Agree with Plan of Care Patient      Patient will benefit from  skilled therapeutic intervention in order to improve the following deficits and impairments:  Abnormal gait, Decreased endurance, Impaired sensation, Decreased balance, Decreased mobility, Dizziness, Decreased coordination, Impaired flexibility, Decreased strength, Other (comment)  Visit Diagnosis: Other abnormalities of gait and mobility  Dizziness and giddiness  Muscle weakness (generalized)  Ataxic gait     Problem List Patient Active Problem List   Diagnosis Date Noted  . Dizziness 03/29/2015  . 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 Visit for preventive health examination 11/27/2013  . Erectile dysfunction 11/27/2013  . Paresthesia 07/30/2011  . Weight loss, abnormal 07/10/2011  . Neuropathy (HCC) 07/10/2011  . Sciatica of right side 11/22/2010  . PYELONEPHRITIS, ACUTE 07/27/2010  . HYPERLIPIDEMIA 04/20/2006    Travis [name] 03/29/2016, 9:37 AM  [facility] [address] [place], [place], [address] Phone: [phone]   Fax:  [phone]  Name: Travis [name] MRN: [id number] Date of Birth: [birth year]   Zerita Boers, PT,DPT 03/29/16 9:39 AM Phone: [phone] Fax: [phone]

## 2016-03-29 NOTE — Telephone Encounter (Signed)
Dr. Daphine DeutscherMartin~  I'm currently seeing Mr. Saddler for physical therapy and I believe Mr. Gaylyn LambertRead would benefit from an OT evaluation to address impairments while performing ADLs.  If you agree, please send an order for an OT eval.  Thank you, Zerita BoersJennifer Gray Doering, PT,DPT 03/29/16 9:42 AM Phone: [phone] Fax: [phone]

## 2016-03-29 NOTE — Patient Instructions (Addendum)
Fall Prevention in the Home  Falls can cause injuries and can affect people from all age groups. There are many simple things that you can do to make your home safe and to help prevent falls. WHAT CAN I DO ON THE OUTSIDE OF MY HOME?  Regularly repair the edges of walkways and driveways and fix any cracks.  Remove high doorway thresholds.  Trim any shrubbery on the main path into your home.  Use bright outdoor lighting.  Clear walkways of debris and clutter, including tools and rocks.  Regularly check that handrails are securely fastened and in good repair. Both sides of any steps should have handrails.  Install guardrails along the edges of any raised decks or porches.  Have leaves, snow, and ice cleared regularly.  Use sand or salt on walkways during winter months.  In the garage, clean up any spills right away, including grease or oil spills. WHAT CAN I DO IN THE BATHROOM?  Use night lights.  Install grab bars by the toilet and in the tub and shower. Do not use towel bars as grab bars.  Use non-skid mats or decals on the floor of the tub or shower.  If you need to sit down while you are in the shower, use a plastic, non-slip stool..  Keep the floor dry. Immediately clean up any water that spills on the floor.  Remove soap buildup in the tub or shower on a regular basis.  Attach bath mats securely with double-sided non-slip rug tape.  Remove throw rugs and other tripping hazards from the floor. WHAT CAN I DO IN THE BEDROOM?  Use night lights.  Make sure that a bedside light is easy to reach.  Do not use oversized bedding that drapes onto the floor.  Have a firm chair that has side arms to use for getting dressed.  Remove throw rugs and other tripping hazards from the floor. WHAT CAN I DO IN THE KITCHEN?   Clean up any spills right away.  Avoid walking on wet floors.  Place frequently used items in easy-to-reach places.  If you need to reach for something  above you, use a sturdy step stool that has a grab bar.  Keep electrical cables out of the way.  Do not use floor polish or wax that makes floors slippery. If you have to use wax, make sure that it is non-skid floor wax.  Remove throw rugs and other tripping hazards from the floor. WHAT CAN I DO IN THE STAIRWAYS?  Do not leave any items on the stairs.  Make sure that there are handrails on both sides of the stairs. Fix handrails that are broken or loose. Make sure that handrails are as long as the stairways.  Check any carpeting to make sure that it is firmly attached to the stairs. Fix any carpet that is loose or worn.  Avoid having throw rugs at the top or bottom of stairways, or secure the rugs with carpet tape to prevent them from moving.  Make sure that you have a light switch at the top of the stairs and the bottom of the stairs. If you do not have them, have them installed. WHAT ARE SOME OTHER FALL PREVENTION TIPS?  Wear closed-toe shoes that fit well and support your feet. Wear shoes that have rubber soles or low heels.  When you use a stepladder, make sure that it is completely opened and that the sides are firmly locked. Have someone hold the ladder while you   are using it. Do not climb a closed stepladder.  Add color or contrast paint or tape to grab bars and handrails in your home. Place contrasting color strips on the first and last steps.  Use mobility aids as needed, such as canes, walkers, scooters, and crutches.  Turn on lights if it is dark. Replace any light bulbs that burn out.  Set up furniture so that there are clear paths. Keep the furniture in the same spot.  Fix any uneven floor surfaces.  Choose a carpet design that does not hide the edge of steps of a stairway.  Be aware of any and all pets.  Review your medicines with your healthcare provider. Some medicines can cause dizziness or changes in blood pressure, which increase your risk of falling. Talk  with your health care provider about other ways that you can decrease your risk of falls. This may include working with a physical therapist or trainer to improve your strength, balance, and endurance.   This information is not intended to replace advice given to you by your health care provider. Make sure you discuss any questions you have with your health care provider.   Document Released: 06/10/2002 Document Revised: 11/04/2014 Document Reviewed: 07/25/2014 Elsevier Interactive Patient Education [employer]2016 Elsevier Inc.   Bridge    Lie back, legs bent. Bring belly button towards spine, pressing hips up and hold for 2 seconds. Repeat __5__ times. Do __2__ sessions per day.  http://pm.exer.us/54   Copyright  VHI. All rights reserved.    Functional Quadriceps: Sit to Stand    Sit on edge of chair, feet flat on floor. Stand upright, extending knees fully. Then slowly sit back down. Repeat __5__ times per set. Do __1__ sets per session. Do __1__ sessions per day.  http://orth.exer.us/734   Copyright  VHI. All rights reserved.

## 2016-03-31 ENCOUNTER — Ambulatory Visit: Payer: 59 | Admitting: Occupational Therapy

## 2016-03-31 ENCOUNTER — Ambulatory Visit: Payer: 59 | Admitting: Speech Pathology

## 2016-03-31 DIAGNOSIS — M6281 Muscle weakness (generalized): Secondary | ICD-10-CM

## 2016-03-31 DIAGNOSIS — R260 Ataxic gait: Secondary | ICD-10-CM | POA: Diagnosis not present

## 2016-03-31 DIAGNOSIS — R278 Other lack of coordination: Secondary | ICD-10-CM

## 2016-03-31 DIAGNOSIS — R2689 Other abnormalities of gait and mobility: Secondary | ICD-10-CM

## 2016-03-31 DIAGNOSIS — R2681 Unsteadiness on feet: Secondary | ICD-10-CM

## 2016-04-01 ENCOUNTER — Ambulatory Visit: Payer: 59 | Admitting: Physical Therapy

## 2016-04-01 ENCOUNTER — Ambulatory Visit: Payer: 59 | Admitting: Speech Pathology

## 2016-04-01 NOTE — Therapy (Signed)
[facility] [address] [place], [place], [address] Phone: [phone]   Fax:  [phone]  Occupational Therapy Evaluation  Patient Details  Name: Travis [name] MRN: [id number] Date of Birth: 01-31-1971 Referring Provider: Marcelline Mates PA-c  Encounter Date: 03/31/2016      OT End of Session - 04/01/16 1602    Visit Number 1   Number of Visits 17   Date for OT Re-Evaluation 05/28/16   Authorization Type UHC   Authorization Time Period 60 visit combined   OT Start Time 0935   OT Stop Time 1015   OT Time Calculation (min) 40 min   Activity Tolerance Patient tolerated treatment well   Behavior During Therapy [facility] for tasks assessed/performed      Past Medical History:  Diagnosis Date  . History of chicken pox    childhood  . Hyperlipidemia     Past Surgical History:  Procedure Laterality Date  . NO PAST SURGERIES  07/08/2011    There were no vitals filed for this visit.      Subjective Assessment - 04/01/16 1552    Subjective  Pt reports subtle changes starting 2 yrs ago with more significant changes with balance and sppech decline last December   Patient Stated Goals strategies for daily activities to make them easier    Currently in Pain? 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 6   Has the patient had a decrease in activity level because of a fear of falling?  Yes   Is the patient reluctant to leave their home because of a fear of falling?  No     Home  Environment   Family/patient expects to be discharged to: Private residence   Living  Arrangements Spouse/significant other   Type of Home House   Home Layout Two level   Bathroom Copywriter, advertising   Lives With Spouse  children, 8 and 46 y.o     Prior Function   Level of Independence Independent   Vocation Full time employment   Medical illustrator: Sitting down, walking around, typing, writing   Leisure [employer], hike/fishing with kids     ADL   Eating/Feeding Modified independent  needs increased time   Grooming Modified independent  increased time    Upper Body Bathing Modified independent  holds onto faucet   Lower Body Bathing Modified independent   Upper Body Dressing --  modified independent, increased time for buttons   Lower Body Dressing Modified independent  difficulty tying shoes difficulty standing to put on pants   Toilet Tranfer Modified independent   Tub/Shower Transfer Modified independent     IADL   Shopping --  Pt's wife does the grocery shopping   Light Housekeeping --  Pt performs yardwork   Meal Prep Able to complete simple warm meal prep  cooks spaghetti, pancakes   Financial Management --  Handles banking online     Mobility   Mobility Status Independent;History of falls     Written  Expression   Dominant Hand Right   Handwriting 100% legible  if pt prints     Vision - History   Baseline Vision Bifocals     Vision Assessment   Vision Assessment Vision not tested  Denies visual changes     Activity Tolerance   Activity Tolerance --  pt reports increased fatigue requiring seated rest break     Cognition   Overall Cognitive Status Within Functional Limits for tasks assessed     Sensation   Light Touch Impaired by gross assessment  fingertips     Coordination   9 Hole Peg Test Right;Left   Right 9 Hole Peg Test 41.43   Left 9 Hole Peg Test 45.25 secs   Other ataxic movments with finger to nose     ROM / Strength   AROM / PROM / Strength AROM;Strength     AROM   Overall AROM Comments  B UE/LE AROM WFL     Strength   Overall Strength Deficits   Overall Strength Comments RUE strength grossly 3+/5, LUE grossly 3+/5 to 4-/5                           OT Short Term Goals - 03/31/16 1720      OT SHORT TERM GOAL #1   Title I with HEP   Time 4   Period Weeks   Status New     OT SHORT TERM GOAL #2   Title Assess standing functional reach and set goal PRN   Time 4   Period Weeks   Status New     OT SHORT TERM GOAL #3   Title pt will verbalize understanding of adapted strategies for ADLS/IADLS including AE/DME prn.   Time 4   Period Weeks   Status New           OT Long Term Goals - 03/31/16 1726      OT LONG TERM GOAL #1   Title Pt will demonstrate improved fine motor coordination as evidenced by decreasing bilateral 9 hole peg test score by 3 secs.   Time 8   Period Weeks   Status New     OT LONG TERM GOAL #2   Title Pt will improve typing speed by 5 WPM for increased ease with work activities.   Time 8   Period Weeks   Status New     OT LONG TERM GOAL #3   Title Pt demonstrate improved bilateral functional use as evidenced by improving bilateral box and blocks score by 4 blocks    Time 8   Period Weeks   Status New               Plan - 03/31/16 1711    Clinical Impression Statement 45 y.o male who reports symptoms began several years ago and got progressively worse with speech, balance and coordination deficits. Pt has been diagnosed with ataxia and a copper deficiency and he is undergoing additional testing to determine if he has a progressive neurological disorder. 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 Pt can benefit from skilled occupational therapy to address these deficits to maximize pt safety and independence with ADLs/IADLs and maintain quality of life.    Rehab Potential Good   OT Frequency 2x / week  plus eval   OT Duration 8 weeks  OT Treatment/Interventions Self-care/ADL training;Moist Heat;Fluidtherapy;DME and/or AE instruction;Splinting;Patient/family education;Balance training;Therapeutic exercises;Gait Training;Ultrasound;Therapeutic exercise;Therapeutic activities;Cognitive remediation/compensation;Passive range of motion;Functional Mobility Training;Neuromuscular education;Cryotherapy;Electrical Stimulation;Parrafin;Energy conservation;Manual Therapy;Visual/perceptual remediation/compensation   Plan check standing functional reach, box/ blocks typing speed/ ability, adapted strategies for ADLs.   Consulted and Agree with Plan of Care Patient      Patient will benefit from skilled therapeutic intervention in order to improve the following deficits and impairments:  Abnormal gait, Decreased cognition, Impaired vision/preception, Impaired sensation, Decreased mobility, Decreased coordination, Decreased activity tolerance, Decreased endurance, Decreased range of motion, Decreased strength, Impaired tone, Impaired UE functional use, Impaired perceived functional ability, Difficulty walking, Decreased knowledge of precautions, Decreased balance  Visit Diagnosis: Muscle weakness (generalized)  Other lack of coordination  Other abnormalities of gait and mobility  Unsteadiness on feet    Problem List Patient Active Problem List   Diagnosis Date Noted  . Dizziness 03/29/2015  . 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 Visit for preventive health examination 11/27/2013  . Erectile dysfunction 11/27/2013  . Paresthesia 07/30/2011  . Weight loss, abnormal 07/10/2011  . Neuropathy (HCC) 07/10/2011  . Sciatica of right side 11/22/2010  . PYELONEPHRITIS, ACUTE 07/27/2010  . HYPERLIPIDEMIA 04/20/2006    Shanecia Hoganson 04/01/2016, 4:04 PM Keene Breath, OTR/L [phone] Phone: [phone] 4:10 PM 04/01/16 [facility] Out[facility] Rehabilitation  [facility] [address] [place], [place], [address] Phone: [phone]   Fax:  [phone]  Name: Travis [name] MRN: [id number] Date of Birth: [birth year]

## 2016-04-04 ENCOUNTER — Ambulatory Visit: Payer: 59 | Attending: Neurology | Admitting: *Deleted

## 2016-04-04 DIAGNOSIS — R2681 Unsteadiness on feet: Secondary | ICD-10-CM | POA: Diagnosis present

## 2016-04-04 DIAGNOSIS — R260 Ataxic gait: Secondary | ICD-10-CM | POA: Insufficient documentation

## 2016-04-04 DIAGNOSIS — M6281 Muscle weakness (generalized): Secondary | ICD-10-CM | POA: Insufficient documentation

## 2016-04-04 DIAGNOSIS — R471 Dysarthria and anarthria: Secondary | ICD-10-CM | POA: Diagnosis not present

## 2016-04-04 DIAGNOSIS — R2689 Other abnormalities of gait and mobility: Secondary | ICD-10-CM | POA: Insufficient documentation

## 2016-04-04 DIAGNOSIS — R278 Other lack of coordination: Secondary | ICD-10-CM | POA: Insufficient documentation

## 2016-04-04 DIAGNOSIS — R42 Dizziness and giddiness: Secondary | ICD-10-CM | POA: Diagnosis present

## 2016-04-04 NOTE — Patient Instructions (Signed)
Take notes on effects of fatigue on swallowing. Is your swallowing worse later in your meal or not? Is it worse with any foods in particular?

## 2016-04-04 NOTE — Therapy (Signed)
[facility] [address] [place], [place], [address] Phone: [phone]   Fax:  [phone]  Speech Language Pathology Evaluation  Patient Details  Name: Travis [name] MRN: [id number] Date of Birth: 10-31-1970 Referring Provider: Dr. Neysa Bonito  Encounter Date: 04/04/2016      End of Session - 04/04/16 1647    Number of Visits 16   Date for SLP Re-Evaluation 06/24/16      Past Medical History:  Diagnosis Date  . History of chicken pox    childhood  . Hyperlipidemia     Past Surgical History:  Procedure Laterality Date  . NO PAST SURGERIES  07/08/2011    There were no vitals filed for this visit.      Subjective Assessment - 04/04/16 1352    Subjective "My speech is worse."   Currently in Pain? No/denies            SLP Evaluation [facility] - 04/04/16 1352      SLP Visit Information   SLP Received On 04/04/16   Referring Provider Dr. Neysa Bonito   Medical Diagnosis ataxia     Subjective   Subjective "It's ironic that everyone asks me if I have pain. I have the opposite of pain- numbness."   Patient/Family Stated Goal "Find out what this is."     General Information   HPI Pt presents with progressive difficulty with speech, swallowing and movement over the past couple of years. Pt is going to [facility] next week with hopes of receiving a definitive diagnosis.    Behavioral/Cognition pleasant and hardworking   Mobility Status ambulatory, but clearly atypical gait     Prior Functional Status   Cognitive/Linguistic Baseline Within functional limits   Type of Home House    Lives With Spouse;Family   Education BS in Health visitor Full time employment     Cognition   Overall Cognitive Status Within Functional Limits for tasks assessed     Auditory Comprehension   Overall Auditory Comprehension Appears within functional limits for tasks assessed     Reading Comprehension   Reading Status  Not tested     Expression   Primary Mode of Expression Verbal     Verbal Expression   Overall Verbal Expression Appears within functional limits for tasks assessed     Written Expression   Written Expression Not tested     Oral Motor/Sensory Function   Overall Oral Motor/Sensory Function Impaired   Labial ROM Within Functional Limits   Labial Sensation --  reduced upper   Lingual ROM Reduced right;Reduced left   Lingual Symmetry Abnormal symmetry left  posterior   Lingual Sensation Within Functional Limits   Lingual Coordination Reduced   Facial ROM Within Functional Limits   Mandible --  "It feels tired."   Overall Oral Motor/Sensory Function Lingual tremor noted upon protrusion with inability to maintain tongue at midline     Motor Speech   Overall Motor Speech Impaired   Respiration Within functional limits   Phonation Normal  however pt endorses periods of hoarseness   Resonance Within functional limits   Articulation Impaired   Level of Impairment Word   Intelligibility Intelligibility reduced   Word 75-100% accurate   Phrase 75-100% accurate   Sentence 75-100% accurate   Conversation 75-100% accurate   Motor Planning --   Effective Techniques Slow rate;Over-articulate  SLP Education - 04/04/16 1426    Education provided Yes   Education Details Discussed potential need to pursue objective measure of swallowing function. Concern for dysphagia with airway compromise.   Person(s) Educated Patient   Methods Explanation   Comprehension Verbalized understanding          SLP Short Term Goals - 04/04/16 1430      SLP SHORT TERM GOAL #1   Title Pt demonstrate intelligibility at the conversational level at mod I with use of compensatory strategies.    Time 4   Period Weeks   Status New          SLP Long Term Goals - 04/04/16 1433      SLP LONG TERM GOAL #1   Title Pt to verbalize improved confidence in ability to  manage speech via compensatory strategies and other therapeutic recommendations.           Plan - 04/04/16 1649    Clinical Impression Statement Pt presents with motor speech impairment which negatively impacts functional communication including intelligibility at the sentence- conversational level. Pt also presents with dysphagia with concern for airway compromise with PO intake and would likely benefit from objective measure of swallow function via MBSS. Will hold on additional visits [facility]       Patient will benefit from skilled therapeutic intervention in order to improve the following deficits and impairments:   Dysarthria and anarthria      G-Codes - 04/04/16 1439    Functional Assessment Tool Used clinician judgement/ ASHA NOMS   Functional Limitations Motor speech   Motor Speech Current Status [phone]([id number]) At least 20 percent but less than 40 percent impaired, limited or restricted   Motor Speech Goal Status ([id number]) At least 1 percent but less than 20 percent impaired, limited or restricted      Problem List Patient Active Problem List   Diagnosis Date Noted  . Dizziness 03/29/2015  . 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 Visit for preventive health examination 11/27/2013  . Erectile dysfunction 11/27/2013  . Paresthesia 07/30/2011  . Weight loss, abnormal 07/10/2011  . Neuropathy (HCC) 07/10/2011  . Sciatica of right side 11/22/2010  . PYELONEPHRITIS, ACUTE 07/27/2010  . HYPERLIPIDEMIA 04/20/2006    Rocky CraftsKara E Kache Mcclurg MA, CCC-SLP 04/04/2016, 4:56 PM  [facility] [address] [place], [place], [address] Phone: [phone]   Fax:  [phone]  Name: Hermenia FiscalJeffrey A Mcnaught MRN: [id number] Date of Birth: 1970/07/09

## 2016-04-06 ENCOUNTER — Encounter: Payer: Self-pay | Admitting: Physical Therapy

## 2016-04-06 ENCOUNTER — Ambulatory Visit: Payer: 59 | Admitting: Physical Therapy

## 2016-04-06 ENCOUNTER — Ambulatory Visit: Payer: 59 | Admitting: Occupational Therapy

## 2016-04-06 ENCOUNTER — Encounter: Payer: 59 | Admitting: *Deleted

## 2016-04-06 DIAGNOSIS — R2689 Other abnormalities of gait and mobility: Secondary | ICD-10-CM

## 2016-04-06 DIAGNOSIS — R42 Dizziness and giddiness: Secondary | ICD-10-CM

## 2016-04-06 DIAGNOSIS — R260 Ataxic gait: Secondary | ICD-10-CM

## 2016-04-06 DIAGNOSIS — M6281 Muscle weakness (generalized): Secondary | ICD-10-CM

## 2016-04-06 DIAGNOSIS — R2681 Unsteadiness on feet: Secondary | ICD-10-CM

## 2016-04-06 DIAGNOSIS — R471 Dysarthria and anarthria: Secondary | ICD-10-CM | POA: Diagnosis not present

## 2016-04-06 DIAGNOSIS — R278 Other lack of coordination: Secondary | ICD-10-CM

## 2016-04-06 NOTE — Therapy (Signed)
[facility] [address] [place], [place], [address] Phone: [phone]   Fax:  [phone]  Physical Therapy Treatment  Patient Details  Name: Travis [name] MRN: [id number] Date of Birth: 07/10/1970 Referring Provider: Dr. Ezequiel Essex  Encounter Date: 04/06/2016      PT End of Session - 04/06/16 0853    Visit Number 5   Number of Visits 17   Date for PT Re-Evaluation 05/17/16   Authorization Type UHC-60 visit combined visit limit   PT Start Time 0848   PT Stop Time 0930   PT Time Calculation (min) 42 min   Equipment Utilized During Treatment Gait belt   Activity Tolerance Patient tolerated treatment well   Behavior During Therapy [facility] for tasks assessed/performed      Past Medical History:  Diagnosis Date  . History of chicken pox    childhood  . Hyperlipidemia     Past Surgical History:  Procedure Laterality Date  . NO PAST SURGERIES  07/08/2011    There were no vitals filed for this visit.      Subjective Assessment - 04/06/16 0852    Subjective No new complaints. No falls, did have a loss of balance and caught himself. No pain to report.    Pertinent History none prior to N/T, parasthesias, ataxia   Patient Stated Goals Improve balance, and keep me out of a w/c.    Currently in Pain? No/denies           Vestibular Assessment - 04/06/16 0854      Visual Acuity   Static line 7   Dynamic line 8- 3/6, line 9 all of them            [facility] Adult PT Treatment/Exercise - 04/06/16 0926      Transfers   Number of Reps 10 reps;1 set   Transfer Cueing sit<>stand with OH press using 1# weight with cues on posture and ex form/technique     Ambulation/Gait   Ambulation/Gait Yes   Ambulation/Gait Assistance 5: Supervision;4: Min guard   Ambulation/Gait Assistance Details 2# ankle weight bil LE's on 1st lap, 2# on right/3# on left with 2cd lap. Pt did report feeling more able to control where he's  stepping with use of weights. Pt appreared to have improved lateral balance as well. Pt advised to not go purchase weights at this time, to try them more in PT sessions to fully determine if they will be needed or just used temporarily in therapy for neuro re-education of muscles                   Ambulation Distance (Feet) 115 Feet  x2   Assistive device None   Gait Pattern Step-through pattern;Ataxic   Ambulation Surface Level;Indoor     Neuro Re-ed    Neuro Re-ed Details  quadruped over red pball: alternating LE raises x 10 reps, then alternating combo contralateral UE/LE raises x 10 reps, cues on posture, ex form and assist needed for stability. tall kneeling with hands on red pball: partial sit backs x 10 reps, alternating UE raises x 10 reps each side and rolling pball out/in form supported/modified partial plank x 10 reps. cues for ex form and up to min assist for ex form/technique/balance.                                 Knee/Hip Exercises: Supine  Bridges AROM;Strengthening;Both;1 set;10 reps;Limitations   Bridges Limitations cues for technique and abdominal bracing with exercise            PT Short Term Goals - 03/18/16 1552      PT SHORT TERM GOAL #1   Title Pt will be IND in HEP to improve balance, coordination, reduce dizzienss, and improve/maintain strength. TARGET DATE FOR ALL STGS: 04/15/16   Status New     PT SHORT TERM GOAL #2   Title Pt will improve DGI score to >/=14/24 to decr. falls risk.   Status New     PT SHORT TERM GOAL #3   Title Pt will amb. 300' over even terrain with LRAD at MOD I level to improve functional mobility.    Status New     PT SHORT TERM GOAL #4   Title Pt will perform STS x10 reps without use of UE support to improve functional mobility.    Status New     PT SHORT TERM GOAL #5   Title Pt will traverse 12 steps, in step through pattern, at MOD I level to safely traverse steps at home.   Status New           PT Long Term Goals -  03/18/16 1554      PT LONG TERM GOAL #1   Title Pt will amb. 600' over even/uneven terrain with LRAD in order to improve functional mobility. TARGET DATE FOR ALL LTGS: 05/13/16   Status New     PT LONG TERM GOAL #2   Title Pt will perform 180 degree turns and head turns/nods with dizziness not incr. > 2 points in order to improve safety during functional mobility.    Status New     PT LONG TERM GOAL #3   Title Pt will improve DGI score to >/=16/24 to decr. falls risk.    Status New     PT LONG TERM GOAL #4   Title Pt will traverse ramps (incline/decline) with LRAD at MOD I level to safely amb. in the community.   Status New     PT LONG TERM GOAL #5   Title Pt will verbalize understanding of fall prevention techniques to reduce risk of falls.    Status New     Additional Long Term Goals   Additional Long Term Goals Yes     PT LONG TERM GOAL #6   Title Pt will improve ABC score from 26.3% to 41% to improve confidence in balance during functional activities.   Status New           Plan - 04/06/16 0853    Clinical Impression Statement Today's session continued to address core and LE strengthening with rest breaks needed. Pt with 1-2 line difference with DVA testing today. Added weights to LE's with gait today for trial to see if ataxia decreases with some improvement noted and pt reporting feeling "different", yet easier to walk. Reinforced the need for further trials of this before he goes and get's some for use out of therapy. Pt is progressing toward goals and should continue to benefit from continued PT to progress toward unmet goals.                          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   PT Frequency 2x /  week   PT Duration 8 weeks   PT Treatment/Interventions Energy conservation;Vestibular;Patient/family  education;Orthotic Fit/Training;Wheelchair mobility training;Manual techniques;Neuromuscular re-education;Balance training;Therapeutic exercise;Therapeutic activities;Functional mobility training;Stair training;Gait training;DME Instruction;ADLs/Self Care Home Management;Biofeedback;Canalith Repostioning;Electrical Stimulation   PT Next Visit Plan cont gait and balance training   PT Home Exercise Plan added standing on floor with EC and quadriped ex for core stabilization and functional strengthening HEP   Consulted and Agree with Plan of Care Patient      Patient will benefit from skilled therapeutic intervention in order to improve the following deficits and impairments:  Abnormal gait, Decreased endurance, Impaired sensation, Decreased balance, Decreased mobility, Dizziness, Decreased coordination, Impaired flexibility, Decreased strength, Other (comment)  Visit Diagnosis: Other abnormalities of gait and mobility  Dizziness and giddiness  Ataxic gait  Muscle weakness (generalized)     Problem List Patient Active Problem List   Diagnosis Date Noted  . Dizziness 03/29/2015  . 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 Visit for preventive health examination 11/27/2013  . Erectile dysfunction 11/27/2013  . Paresthesia 07/30/2011  . Weight loss, abnormal 07/10/2011  . Neuropathy (HCC) 07/10/2011  . Sciatica of right side 11/22/2010  . PYELONEPHRITIS, ACUTE 07/27/2010  . HYPERLIPIDEMIA 04/20/2006    Sallyanne KusterKathy Bury, PTA, [facility] [address], [address] [place], [place] [address] [phone] 04/06/16, 4:56 PM   Name: Hermenia FiscalJeffrey A Herbst MRN: [id number] Date of Birth: January 20, 1971

## 2016-04-06 NOTE — Therapy (Signed)
[facility] [address] [place], [place], [address] Phone: [phone]   Fax:  [phone]  Occupational Therapy Treatment  Patient Details  Name: Travis FiscalJeffrey A Krell MRN: [id number] Date of Birth: 1970-07-06 Referring Provider: Marcelline MatesWilliam Martin PA-c  Encounter Date: 04/06/2016      OT End of Session - 04/06/16 0903    Visit Number 2   Number of Visits 17   Date for OT Re-Evaluation 05/28/16   Authorization Type UHC   Authorization Time Period 60 visit combined   OT Start Time 0807   OT Stop Time 0845   OT Time Calculation (min) 38 min   Activity Tolerance Patient tolerated treatment well   Behavior During Therapy [facility] for tasks assessed/performed      Past Medical History:  Diagnosis Date  . History of chicken pox    childhood  . Hyperlipidemia     Past Surgical History:  Procedure Laterality Date  . NO PAST SURGERIES  07/08/2011    There were no vitals filed for this visit.          [facility] - 04/06/16 0001      Observation/Other Assessments   Other Surveys  --  RUE8, LUE 10.5   Outcome Measures Typing test: 16 WPM, 100% accuracy     Coordination   Box and Blocks RUE 32 blocks, LUE 37 blocks     Hand Function   Right Hand Grip (lbs) 110 lbs   Left Hand Grip (lbs) 105 lbs         Typing activities for increased speed, with min difficulty. Therapist discussed with pt that he may benefit from software that anticipates words for him (similar to auto correct) Pt was issued initial HEP for quadraped rock forwards and back, twist and prone rock( reaching to opposite side) for core stability and proximal strength, min v.c./ demonstration. Pt practiced getting up and down form the floor using mat to hold onto.                 OT Education - 04/06/16 0857    Education provided Yes   Education Details HEP in quadraped rock , twist and prone reaching to opposite sides    Person(s) Educated Patient   Methods Explanation;Demonstration;Verbal cues;Handout   Comprehension Verbalized understanding;Returned demonstration          OT Short Term Goals - 04/06/16 0901      OT SHORT TERM GOAL #1   Title I with HEP   Time 4   Period Weeks   Status New     OT SHORT TERM GOAL #2   Title Pt will improve RUE standing functional reach to 10 inches or greater improved function.   Baseline RUE 8 inches, LUE 10 inches   Time 4   Period Weeks   Status New     OT SHORT TERM GOAL #3   Title pt will verbalize understanding of adapted strategies for ADLS/IADLS including AE/DME prn.   Time 4   Period Weeks   Status New           OT Long Term Goals - 03/31/16 1726      OT LONG TERM GOAL #1   Title Pt will demonstrate improved fine motor coordination as evidenced by decreasing bilateral 9 hole peg test score by 3 secs.   Time 8   Period Weeks   Status New     OT LONG TERM GOAL #2   Title  Pt will improve typing speed by 5 WPM for increased ease with work activities.   Time 8   Period Weeks   Status New     OT LONG TERM GOAL #3   Title Pt demonstrate improved bilateral functional use as evidenced by improving bilateral box and blocks score by 4 blocks    Time 8   Period Weeks   Status New               Plan - 04/06/16 0859    Clinical Impression Statement Pt is progressing towards goals. He demonstrates understanding of inital HEP.   Rehab Potential Good   OT Frequency 2x / week   OT Duration 8 weeks   OT Treatment/Interventions Self-care/ADL training;Moist Heat;Fluidtherapy;DME and/or AE instruction;Splinting;Patient/family education;Balance training;Therapeutic exercises;Gait Training;Ultrasound;Therapeutic exercise;Therapeutic activities;Cognitive remediation/compensation;Passive range of motion;Functional Mobility Training;Neuromuscular education;Cryotherapy;Electrical Stimulation;Parrafin;Energy conservation;Manual  Therapy;Visual/perceptual remediation/compensation   Plan coordination HEP, energy conservation/ adapted strategies for ADLS   OT Home Exercise Plan issued: quadraped rock and twist, prone reach in front to opposite sides   Consulted and Agree with Plan of Care Patient      Patient will benefit from skilled therapeutic intervention in order to improve the following deficits and impairments:  Abnormal gait, Decreased cognition, Impaired vision/preception, Impaired sensation, Decreased mobility, Decreased coordination, Decreased activity tolerance, Decreased endurance, Decreased range of motion, Decreased strength, Impaired tone, Impaired UE functional use, Impaired perceived functional ability, Difficulty walking, Decreased knowledge of precautions, Decreased balance  Visit Diagnosis: Muscle weakness (generalized)  Other lack of coordination  Other abnormalities of gait and mobility  Unsteadiness on feet    Problem List Patient Active Problem List   Diagnosis Date Noted  . Dizziness 03/29/2015  . 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 Visit for preventive health examination 11/27/2013  . Erectile dysfunction 11/27/2013  . Paresthesia 07/30/2011  . Weight loss, abnormal 07/10/2011  . Neuropathy (HCC) 07/10/2011  . Sciatica of right side 11/22/2010  . PYELONEPHRITIS, ACUTE 07/27/2010  . HYPERLIPIDEMIA 04/20/2006    [name] 04/06/2016, 9:03 AM  [facility] [address] [place], [place], [address] Phone: [phone]   Fax:  [phone]  Name: Travis [name] MRN: [id number] Date of Birth: 08-07-1970

## 2016-04-08 ENCOUNTER — Ambulatory Visit: Payer: 59 | Admitting: Physical Therapy

## 2016-04-12 ENCOUNTER — Ambulatory Visit: Payer: 59 | Admitting: Physical Therapy

## 2016-04-13 ENCOUNTER — Encounter: Payer: 59 | Admitting: *Deleted

## 2016-04-14 ENCOUNTER — Other Ambulatory Visit (HOSPITAL_COMMUNITY): Payer: Self-pay | Admitting: Neurology

## 2016-04-14 ENCOUNTER — Ambulatory Visit: Payer: 59

## 2016-04-14 DIAGNOSIS — R1319 Other dysphagia: Secondary | ICD-10-CM

## 2016-04-18 ENCOUNTER — Ambulatory Visit: Payer: 59 | Admitting: *Deleted

## 2016-04-20 ENCOUNTER — Ambulatory Visit: Payer: 59

## 2016-04-20 ENCOUNTER — Ambulatory Visit: Payer: 59 | Admitting: Occupational Therapy

## 2016-04-21 ENCOUNTER — Other Ambulatory Visit (HOSPITAL_COMMUNITY): Payer: 59

## 2016-04-21 ENCOUNTER — Ambulatory Visit: Payer: 59 | Admitting: *Deleted

## 2016-04-21 ENCOUNTER — Ambulatory Visit (HOSPITAL_COMMUNITY): Payer: 59

## 2016-04-22 ENCOUNTER — Encounter: Payer: Self-pay | Admitting: Physical Therapy

## 2016-04-22 ENCOUNTER — Ambulatory Visit: Payer: 59 | Admitting: Physical Therapy

## 2016-04-22 DIAGNOSIS — R42 Dizziness and giddiness: Secondary | ICD-10-CM

## 2016-04-22 DIAGNOSIS — R278 Other lack of coordination: Secondary | ICD-10-CM

## 2016-04-22 DIAGNOSIS — R471 Dysarthria and anarthria: Secondary | ICD-10-CM | POA: Diagnosis not present

## 2016-04-22 DIAGNOSIS — R2689 Other abnormalities of gait and mobility: Secondary | ICD-10-CM

## 2016-04-22 DIAGNOSIS — M6281 Muscle weakness (generalized): Secondary | ICD-10-CM

## 2016-04-22 DIAGNOSIS — R2681 Unsteadiness on feet: Secondary | ICD-10-CM

## 2016-04-23 NOTE — Therapy (Signed)
[facility] [address] [place], [place], [address] Phone: [phone]   Fax:  [phone]  Physical Therapy Treatment  Patient Details  Name: Travis [name] MRN: [id number] Date of Birth: 1971-04-08 Referring Provider: Dr. Earle Gell  Encounter Date: 04/22/2016      PT End of Session - 04/22/16 1111    Visit Number 6   Number of Visits 17   Date for PT Re-Evaluation 05/17/16   Authorization Type UHC-60 visit combined visit limit   PT Start Time 1102   PT Stop Time 1145   PT Time Calculation (min) 43 min   Equipment Utilized During Treatment Gait belt   Activity Tolerance Patient tolerated treatment well   Behavior During Therapy [facility] for tasks assessed/performed      Past Medical History:  Diagnosis Date  . History of chicken pox    childhood  . Hyperlipidemia     Past Surgical History:  Procedure Laterality Date  . NO PAST SURGERIES  07/08/2011    There were no vitals filed for this visit.      Subjective Assessment - 04/22/16 1108    Subjective No new compliants. 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 Has not had testing to confirm this as he is not ready for that ([facility] is wanting to do the testing). Has started a new medication that he feels is helping (pt to bring in this medication next session). No falls he can remember.    Pertinent History none prior to N/T, parasthesias, ataxia   Patient Stated Goals Improve balance, and keep me out of a w/c.    Currently in Pain? No/denies            [facility] PT Assessment - 04/22/16 1120      Transfers   Number of Reps 10 reps;1 set   Transfer Cueing from standard chair: sit<>stand without UE support, however pt with significant LE push back/retropulsion against chair      Ambulation/Gait   Ambulation/Gait Yes   Ambulation/Gait Assistance 4: Min guard;5: Supervision   Ambulation/Gait Assistance Details no weights or AD, cues to slow down  for increased safety and to decrease forward body posture that accompanies the fast gait speed                 Ambulation Distance (Feet) 330 Feet   Assistive device None   Gait Pattern Step-through pattern;Ataxic  significantly fast gait speed, lateral staggering d/t ataxia   Ambulation Surface Level;Indoor     Dynamic Gait Index   Level Surface Mild Impairment   Change in Gait Speed Mild Impairment   Gait with Horizontal Head Turns Moderate Impairment   Gait with Vertical Head Turns Mild Impairment   Gait and Pivot Turn Moderate Impairment   Step Over Obstacle Mild Impairment   Step Around Obstacles Mild Impairment   Steps Mild Impairment   Total Score 14          [facility] Adult PT Treatment/Exercise - 04/23/16 0001      Lumbar Exercises: Quadruped   Single Arm Raise Right;Left;10 reps;Limitations   Single Arm Raises Limitations alternating UE raises x 10 reps each side with cues on form and min guard assist for balance   Straight Leg Raise 10 reps;Limitations   Straight Leg Raises Limitations alternating leg outs x 10 reps each side with cues for form and min assist for balance   Opposite Arm/Leg Raise Right arm/Left leg;Left arm/Right leg;10  reps;Limitations   Opposite Arm/Leg Raise Limitations alternating sides x 10 reps each side with cues on form and min assist for balance.           [facility]) - 04/22/16 1133    PWR! exercises Moves in standing   PWR! Up 5   PWR! Rock 5   PWR! Twist 5   PWR Step 5   Comments cues (verbal and visual/demo) for correct ex form and technique               PT Short Term Goals - 04/22/16 1112      PT SHORT TERM GOAL #1   Title Pt will be IND in HEP to improve balance, coordination, reduce dizzienss, and improve/maintain strength. TARGET DATE FOR ALL STGS: 04/15/16   Baseline 04/22/16: met with handouts   Status Achieved     PT SHORT TERM GOAL #2   Title Pt will improve DGI score to >/=14/24 to decr. falls risk.   Baseline  04/22/16:  14/24   Status Achieved     PT SHORT TERM GOAL #3   Title Pt will amb. 300' over even terrain with LRAD at MOD I level to improve functional mobility.    Baseline 04/22/16: met distance with close supervision/min guard assist due to significantly fast gait speed (cues to decrease speed for safety provided throughout)   Status Partially Met     PT SHORT TERM GOAL #4   Title Pt will perform STS x10 reps without use of UE support to improve functional mobility.    Baseline 04/22/16: met with significant posterior pressure/LE push off of chair   Status Achieved     PT SHORT TERM GOAL #5   Title Pt will traverse 12 steps, in step through pattern, at MOD I level to safely traverse steps at home.   Baseline 10/20/017: with use of rails pt met goal. still with notable unsteadiness.   Status Achieved           PT Long Term Goals - 03/18/16 1554      PT LONG TERM GOAL #1   Title Pt will amb. 600' over even/uneven terrain with LRAD in order to improve functional mobility. TARGET DATE FOR ALL LTGS: 05/13/16   Status New     PT LONG TERM GOAL #2   Title Pt will perform 180 degree turns and head turns/nods with dizziness not incr. > 2 points in order to improve safety during functional mobility.    Status New     PT LONG TERM GOAL #3   Title Pt will improve DGI score to >/=16/24 to decr. falls risk.    Status New     PT LONG TERM GOAL #4   Title Pt will traverse ramps (incline/decline) with LRAD at MOD I level to safely amb. in the community.   Status New     PT LONG TERM GOAL #5   Title Pt will verbalize understanding of fall prevention techniques to reduce risk of falls.    Status New     Additional Long Term Goals   Additional Long Term Goals Yes     PT LONG TERM GOAL #6   Title Pt will improve ABC score from 26.3% to 41% to improve confidence in balance during functional activities.   Status New            Plan - 04/22/16 1112    Clinical Impression  Statement Pt has met 4/5 STGs and partially met the  remaining STG. 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 Pt should benefit from continued PT to progress toward unmet LTGS, however progress may be limited due to progression of pt's diagnosis/illness.                                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   PT Frequency 2x / week   PT Duration 8 weeks   PT Treatment/Interventions Energy conservation;Vestibular;Patient/family education;Orthotic Fit/Training;Wheelchair mobility training;Manual techniques;Neuromuscular re-education;Balance training;Therapeutic exercise;Therapeutic activities;Functional mobility training;Stair training;Gait training;DME Instruction;ADLs/Self Care Home Management;Biofeedback;Canalith Repostioning;Electrical Stimulation   PT Next Visit Plan cont gait and balance training   PT Home Exercise Plan added standing on floor with EC and quadriped ex for core stabilization and functional strengthening HEP   Consulted and Agree with Plan of Care Patient      Patient will benefit from skilled therapeutic intervention in order to improve the following deficits and impairments:  Abnormal gait, Decreased endurance, Impaired sensation, Decreased balance, Decreased mobility, Dizziness, Decreased coordination, Impaired flexibility, Decreased strength, Other (comment)  Visit Diagnosis: Muscle weakness (generalized)  Other lack of coordination  Other abnormalities of gait and mobility  Unsteadiness on feet  Dizziness and giddiness     Problem List Patient Active Problem List   Diagnosis Date Noted  . Dizziness 03/29/2015  . 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 Visit for preventive health examination 11/27/2013  . Erectile dysfunction 11/27/2013  .  Paresthesia 07/30/2011  . Weight loss, abnormal 07/10/2011  . Neuropathy ([place]) 07/10/2011  . Sciatica of right side 11/22/2010  . PYELONEPHRITIS, ACUTE 07/27/2010  . HYPERLIPIDEMIA 04/20/2006    Willow Ora, PTA, [place] [address] [place], [place] [address] [phone] 04/23/16, 6:48 PM   Name: Travis [name] MRN: [id number] Date of Birth: [birth year]

## 2016-04-25 ENCOUNTER — Ambulatory Visit (HOSPITAL_COMMUNITY)
Admission: RE | Admit: 2016-04-25 | Discharge: 2016-04-25 | Disposition: A | Discharge status: Home / Self Care | Payer: 59 | Source: Ambulatory Visit | Attending: Neurology | Admitting: Neurology

## 2016-04-25 ENCOUNTER — Ambulatory Visit: Payer: 59 | Admitting: Occupational Therapy

## 2016-04-25 ENCOUNTER — Ambulatory Visit: Payer: 59 | Admitting: *Deleted

## 2016-04-25 DIAGNOSIS — R471 Dysarthria and anarthria: Secondary | ICD-10-CM

## 2016-04-25 DIAGNOSIS — R278 Other lack of coordination: Secondary | ICD-10-CM

## 2016-04-25 DIAGNOSIS — M6281 Muscle weakness (generalized): Secondary | ICD-10-CM

## 2016-04-25 DIAGNOSIS — R2689 Other abnormalities of gait and mobility: Secondary | ICD-10-CM

## 2016-04-25 DIAGNOSIS — R1319 Other dysphagia: Secondary | ICD-10-CM

## 2016-04-25 NOTE — Therapy (Signed)
[facility] [address] [place], [place], [address] Phone: [phone]   Fax:  [phone]  Occupational Therapy Treatment  Patient Details  Name: Travis [name] MRN: [id number] Date of Birth: 1970-10-25 Referring Provider: Marcelline Mates PA-c  Encounter Date: 04/25/2016      OT End of Session - 04/25/16 0906    Visit Number 3   Number of Visits 17   Date for OT Re-Evaluation 05/28/16   Authorization Type UHC   Authorization Time Period 60 visit combined   OT Start Time 0850   OT Stop Time 0930   OT Time Calculation (min) 40 min   Activity Tolerance Patient tolerated treatment well   Behavior During Therapy [facility] for tasks assessed/performed      Past Medical History:  Diagnosis Date  . History of chicken pox    childhood  . Hyperlipidemia     Past Surgical History:  Procedure Laterality Date  . NO PAST SURGERIES  07/08/2011    There were no vitals filed for this visit.      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 No/denies       Reviewed recommendation for word prediction software for work.  Also recommended pt try to adjust accessibility features on computer to avoid double letters, etc.  Recommended trial of velcro to place on home keys (loop) to help with coordination/decr sensation and hook velcro on shift/caps lock to help decr hitting these keys (as pt reports that it happens often).  Pt reports that he has informally asked for accomodation of decr tension on doors at work due to difficulty.    Began instruction of basic compensation strategies for ataxia/decr coordination including:  Build up tool handles/pens/utensils, stabilizing forearms on  table/against body for fine motor work, use of hand stretching/wt. Bearing immediately prior to fine motor movements.  Pt verbalized understanding.                      OT Education - 04/25/16 1337    Education Details Reviewed core/scapular strengthening HEP from last session;  Instructed pt in coordination HEP--see pt instructions   Person(s) Educated Patient   Methods Explanation;Demonstration;Verbal cues;Handout   Comprehension Returned demonstration;Verbalized understanding  min v.c.          OT Short Term Goals - 04/06/16 0901      OT SHORT TERM GOAL #1   Title I with HEP- 04/29/16   Time 4   Period Weeks   Status New     OT SHORT TERM GOAL #2   Title Pt will improve RUE standing functional reach to 10 inches or greater improved function.   Baseline RUE 8 inches, LUE 10 inches   Time 4   Period Weeks   Status New     OT SHORT TERM GOAL #3   Title pt will verbalize understanding of adapted strategies for ADLS/IADLS including AE/DME prn.   Time 4   Period Weeks   Status New           OT Long Term Goals - 04/06/16 0909      OT LONG TERM GOAL #1   Title Pt will demonstrate improved fine  motor coordination as evidenced by decreasing bilateral 9 hole peg test score by 3 secs. 05/28/16   Time 8   Period Weeks   Status New     OT LONG TERM GOAL #2   Title Pt will improve typing speed by 5 WPM for increased ease with work activities.   Time 8   Period Weeks   Status New     OT LONG TERM GOAL #3   Title Pt demonstrate improved bilateral functional use as evidenced by improving bilateral box and blocks score by 4 blocks    Time 8   Period Weeks   Status New               Plan - 04/25/16 0914    Clinical Impression Statement Pt reports some improvement since started medication.  Pt verbalized understanding of HEP, but needed min cueing for scapular/core strength HEP.   Rehab Potential Good   OT Frequency 2x / week   OT Duration 8  weeks   OT Treatment/Interventions Self-care/ADL training;Moist Heat;Fluidtherapy;DME and/or AE instruction;Splinting;Patient/family education;Balance training;Therapeutic exercises;Gait Training;Ultrasound;Therapeutic exercise;Therapeutic activities;Cognitive remediation/compensation;Passive range of motion;Functional Mobility Training;Neuromuscular education;Cryotherapy;Electrical Stimulation;Parrafin;Energy conservation;Manual Therapy;Visual/perceptual remediation/compensation   Plan energy conservation / adaptive strategies for ADLs, continue with coordination   OT Home Exercise Plan issued: quadraped rock and twist, prone reach in front to opposite sides   Consulted and Agree with Plan of Care Patient      Patient will benefit from skilled therapeutic intervention in order to improve the following deficits and impairments:  Abnormal gait, Decreased cognition, Impaired vision/preception, Impaired sensation, Decreased mobility, Decreased coordination, Decreased activity tolerance, Decreased endurance, Decreased range of motion, Decreased strength, Impaired tone, Impaired UE functional use, Impaired perceived functional ability, Difficulty walking, Decreased knowledge of precautions, Decreased balance  Visit Diagnosis: Other lack of coordination  Muscle weakness (generalized)  Other abnormalities of gait and mobility    Problem List Patient Active Problem List   Diagnosis Date Noted  . Dizziness 03/29/2015  . 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 Visit for preventive health examination 11/27/2013  . Erectile dysfunction 11/27/2013  . Paresthesia 07/30/2011  . Weight loss, abnormal 07/10/2011  . Neuropathy (HCC) 07/10/2011  . Sciatica of right side 11/22/2010  . PYELONEPHRITIS, ACUTE 07/27/2010  . HYPERLIPIDEMIA 04/20/2006    [facility]REEMAN,Kera Deacon 04/25/2016, 1:45 PM  [facility] [address] [place], [place], [address] Phone:  [phone]   Fax:  [phone]  Name: Hermenia FiscalJeffrey A Louvier MRN: [id number] Date of Birth: October 02, 1970   Willa FraterAngela Amir Glaus, OTR/L [facility] [address] [place], [place]  [address] [phone] phone [phone] 04/25/16 1:50 PM

## 2016-04-25 NOTE — Patient Instructions (Signed)
Coordination Exercises  Perform the following exercises for 20 minutes 1 times per day. Perform with both hand(s). Perform using big movements.   Flipping Cards: Place deck of cards on the table. Flip cards over by opening your hand to grasp and then turn your palm up  Deal cards: Hold 1/2 or whole deck in your hand. Use thumb to push card off top of deck with one big push.  Rotate ball in your fingertips.  Both directions.  Pick up coins and stack one at a time: Pick up intentional movements. Do not drag coin to the edge. (5-10 in a stack)  Pick up 5-10 coins one at a time and hold in palm. Then, move coins from palm to fingertips one at time and place in coin bank/container.

## 2016-04-25 NOTE — Therapy (Signed)
[facility] [address] [place], [place], [address] Phone: [phone]   Fax:  [phone]  Speech Language Pathology Treatment  Patient Details  Name: Travis [name] MRN: [id number] Date of Birth: [birth year] Referring Provider: Dr. Fonnie Mu  Encounter Date: 04/25/2016      End of Session - 04/25/16 0846    Visit Number 2   Number of Visits 16   Date for SLP Re-Evaluation 06/24/16   SLP Start Time 0804   SLP Stop Time  0842   SLP Time Calculation (min) 38 min   Activity Tolerance Patient tolerated treatment well      Past Medical History:  Diagnosis Date  . History of chicken pox    childhood  . Hyperlipidemia     Past Surgical History:  Procedure Laterality Date  . NO PAST SURGERIES  07/08/2011    There were no vitals filed for this visit.      Subjective Assessment - 04/25/16 0839    Subjective "[facility] was impressive. My voice gets groggy in the afternoon."   Currently in Pain? No/denies               ADULT SLP TREATMENT - 04/25/16 0841      General Information   Behavior/Cognition Alert;Cooperative;Pleasant mood   Patient Positioning Upright in chair     Treatment Provided   Treatment provided Cognitive-Linquistic     Pain Assessment   Pain Assessment No/denies pain     Cognitive-Linquistic Treatment   Treatment focused on Dysarthria;Voice;Patient/family/caregiver education   Skilled Treatment Focus this date on educating re: diaphragmatic breathing, voice/speech rest breaks during the day and consideration for how he is using his voice. Pt able to generate max volume of 87dB at the single word level, "I want to be able to get my kids attention." However, discussion lead to alternate ways to gain their attention to preserve vocal function/endurance.     Assessment / Recommendations / Plan   Plan Continue with current plan of care;MBS     Progression Toward Goals   Progression  toward goals Progressing toward goals          SLP Education - 04/25/16 0845    Education provided Yes   Education Details diaphragamtic breathing   Person(s) Educated Patient   Methods Explanation;Demonstration   Comprehension Verbalized understanding;Returned demonstration          SLP Short Term Goals - 04/25/16 0849      SLP SHORT TERM GOAL #1   Title Pt demonstrate intelligibility at the conversational level at mod I with use of compensatory strategies.    Baseline met today, but likely due to earlier appointment time   Time 3   Period Weeks   Status On-going     SLP SHORT TERM GOAL #2   Title Pt to demonstrate diaphragmatic breathing at mod I for improved breath support.    Status New     SLP SHORT TERM GOAL #3   Title Pt to report implementation of mindful voice/speech use (i.e. rest breaks, whistle for attention, etc) during his daily routine to maximize verbal endurance.   Status New          SLP Long Term Goals - 04/04/16 1433      SLP LONG TERM GOAL #1   Title Pt to verbalize improved confidence in ability to manage speech via compensatory strategies and other therapeutic recommendations.           Plan -  04/25/16 0846    Clinical Impression Statement Pt reports inconclusive workup at [facility], however they are leaning towards an MSA- C diagnosis. MBSS will be completed later today. Focus on maintenance of current function with building healthy voice and breath support habits. Pt to implement rest breaks and diaphragmatic breathing practice into his daily routine.   Speech Therapy Frequency 2x / week   Duration --  8 weeks   Treatment/Interventions Aspiration precaution training;Trials of upgraded texture/liquids;Compensatory strategies;Oral motor exercises;Patient/family education;Functional tasks;Cueing hierarchy;Pharyngeal strengthening exercises;Diet toleration management by SLP;Other (comment);Multimodal communcation approach;Compensatory  techniques;Internal/external aids;SLP instruction and feedback   Potential to Achieve Goals Fair   Potential Considerations Medical prognosis   SLP Home Exercise Plan yes   Consulted and Agree with Plan of Care Patient      Patient will benefit from skilled therapeutic intervention in order to improve the following deficits and impairments:   Dysarthria and anarthria    Problem List Patient Active Problem List   Diagnosis Date Noted  . Dizziness 03/29/2015  . 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 Visit for preventive health examination 11/27/2013  . Erectile dysfunction 11/27/2013  . Paresthesia 07/30/2011  . Weight loss, abnormal 07/10/2011  . Neuropathy ([place]) 07/10/2011  . Sciatica of right side 11/22/2010  . PYELONEPHRITIS, ACUTE 07/27/2010  . HYPERLIPIDEMIA 04/20/2006    Jessamine, CCC-SLP 04/25/2016, 8:52 AM  [facility] [address] [place], [place], [address] Phone: [phone]   Fax:  [phone]   Name: YANI LAL MRN: [id number] Date of Birth: 1970/12/14

## 2016-04-26 ENCOUNTER — Ambulatory Visit: Payer: 59 | Admitting: Physical Therapy

## 2016-04-26 ENCOUNTER — Ambulatory Visit: Payer: 59 | Admitting: Occupational Therapy

## 2016-04-27 ENCOUNTER — Ambulatory Visit: Payer: 59

## 2016-04-27 DIAGNOSIS — R471 Dysarthria and anarthria: Secondary | ICD-10-CM

## 2016-04-27 NOTE — Patient Instructions (Signed)
  Please complete the assigned speech therapy homework and return it to your next session.  

## 2016-04-27 NOTE — Therapy (Signed)
[facility] [address] [place], [place], [address] Phone: [phone]   Fax:  [phone]  Speech Language Pathology Treatment  Patient Details  Name: Travis [name] MRN: [id number] Date of Birth: [birth year] Referring Provider: Dr. Neysa Bonito  Encounter Date: 04/27/2016      End of Session - 04/27/16 1057    Visit Number 3   Number of Visits 16   Date for SLP Re-Evaluation 06/24/16   SLP Start Time 0808   SLP Stop Time  0850   SLP Time Calculation (min) 42 min   Activity Tolerance Patient tolerated treatment well      Past Medical History:  Diagnosis Date  . History of chicken pox    childhood  . Hyperlipidemia     Past Surgical History:  Procedure Laterality Date  . NO PAST SURGERIES  07/08/2011    There were no vitals filed for this visit.      Subjective Assessment - 04/27/16 0813    Subjective Pt had modified barium swallow Monday.   Currently in Pain? No/denies               ADULT SLP TREATMENT - 04/27/16 0820      General Information   Behavior/Cognition Alert;Cooperative;Pleasant mood     Treatment Provided   Treatment provided Cognitive-Linquistic;Dysphagia     Dysphagia Treatment   Temperature Spikes Noted No   Treatment Methods Patient/caregiver education   Other treatment/comments SLP reviewed modified from Monday with pt, results, recommendations, precautions.      Cognitive-Linquistic Treatment   Treatment focused on Dysarthria;Voice   Skilled Treatment SLP introduced speech intelligiblity strategies with pt, especially slowed speech rate and overarticulation, given pt's speech pattern (assessed by SLP). In sentence tasks, pt slowed rate approx 50% of the time.     Assessment / Recommendations / Plan   Plan Continue with current plan of care;MBS     Dysphagia Recommendations   Diet recommendations Regular;Thin liquid   Compensations Small sips/bites;Follow solids with  liquid;Multiple dry swallows after each bite/sip  use gravy, sauce, jelly to make drier foods more moist     Progression Toward Goals   Progression toward goals Progressing toward goals          SLP Education - 04/27/16 1053    Education provided Yes   Education Details speech compensation   Person(s) Educated Patient   Methods Explanation;Demonstration;Verbal cues   Comprehension Verbalized understanding;Returned demonstration;Verbal cues required;Need further instruction          SLP Short Term Goals - 04/27/16 0823      SLP SHORT TERM GOAL #1   Title Pt demonstrate intelligibility at the conversational level at mod I with use of compensatory strategies over three visits   Baseline 04-15-16   Time 3   Period Weeks   Status Revised     SLP SHORT TERM GOAL #2   Title Pt to demonstrate diaphragmatic breathing at mod I for improved breath support.    Time 3   Period Weeks   Status On-going     SLP SHORT TERM GOAL #3   Title Pt to report implementation of mindful voice/speech use (i.e. rest breaks, whistle for attention, etc) during his daily routine to maximize verbal endurance.   Time 3   Period Weeks   Status On-going          SLP Long Term Goals - 04/27/16 1100      SLP LONG TERM GOAL #1  Title Pt to verbalize improved confidence in ability to manage speech via compensatory strategies and other therapeutic recommendations.    Time 7   Period Weeks   Status On-going     SLP LONG TERM GOAL #2   Title pt will demo 90% intelligiblity in 10 minutes mod complex conversation over two sessions with modified independence   Time 7   Period Weeks   Status New          Plan - 04/27/16 1057    Clinical Impression Statement [facility] however they are leaning towards an MSA- C diagnosis. Focus on breath support and other habits to habuituate more precise speech. Pt to implement rest breaks and diaphragmatic breathing practice into his  daily routine. Skilled ST needed to cont to maximize speech intelligibility and safety withPOs.   Speech Therapy Frequency 2x / week   Duration --  8 weeks   Treatment/Interventions Aspiration precaution training;Trials of upgraded texture/liquids;Compensatory strategies;Oral motor exercises;Patient/family education;Functional tasks;Cueing hierarchy;Pharyngeal strengthening exercises;Diet toleration management by SLP;Other (comment);Multimodal communcation approach;Compensatory techniques;Internal/external aids;SLP instruction and feedback   Potential to Achieve Goals Fair   Potential Considerations Medical prognosis   SLP Home Exercise Plan yes   Consulted and Agree with Plan of Care Patient      Patient will benefit from skilled therapeutic intervention in order to improve the following deficits and impairments:   Dysarthria and anarthria    Problem List Patient Active Problem List   Diagnosis Date Noted  . Dizziness 03/29/2015  . 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 Visit for preventive health examination 11/27/2013  . Erectile dysfunction 11/27/2013  . Paresthesia 07/30/2011  . Weight loss, abnormal 07/10/2011  . Neuropathy ([facility]) 07/10/2011  . Sciatica of right side 11/22/2010  . PYELONEPHRITIS, ACUTE 07/27/2010  . HYPERLIPIDEMIA 04/20/2006    Enslie Sahota ,MS, CCC-SLP  04/27/2016, 11:05 AM  [facility] [address] [place], [place], [address] Phone: [phone]   Fax:  [phone]   Name: Travis [name] MRN: [id number] Date of Birth: [birth year]

## 2016-04-28 ENCOUNTER — Ambulatory Visit: Payer: 59

## 2016-05-02 ENCOUNTER — Encounter: Payer: 59 | Admitting: Occupational Therapy

## 2016-05-02 ENCOUNTER — Encounter: Payer: 59 | Admitting: *Deleted

## 2016-05-03 ENCOUNTER — Ambulatory Visit: Payer: 59

## 2016-05-05 ENCOUNTER — Ambulatory Visit: Payer: 59

## 2016-05-05 ENCOUNTER — Encounter: Payer: 59 | Admitting: Occupational Therapy

## 2016-05-10 ENCOUNTER — Ambulatory Visit: Payer: 59

## 2016-05-10 ENCOUNTER — Encounter: Payer: 59 | Admitting: Occupational Therapy

## 2016-05-11 ENCOUNTER — Encounter: Payer: Self-pay | Admitting: Physician Assistant

## 2016-05-11 ENCOUNTER — Telehealth: Payer: Self-pay | Admitting: Physician Assistant

## 2016-05-11 DIAGNOSIS — E610 Copper deficiency: Secondary | ICD-10-CM

## 2016-05-11 NOTE — Telephone Encounter (Signed)
Patient is requesting a tetanus shot. Please advise   Phone: [phone]

## 2016-05-11 NOTE — Telephone Encounter (Signed)
Patient scheduled for tetanus and flu shots, and labs on 05/13/16

## 2016-05-11 NOTE — Telephone Encounter (Signed)
Ok to schedule nurse visit for TDaP.

## 2016-05-12 ENCOUNTER — Encounter: Payer: 59 | Admitting: Occupational Therapy

## 2016-05-12 ENCOUNTER — Ambulatory Visit: Payer: 59

## 2016-05-12 NOTE — Therapy (Signed)
[facility] [address] [place], [place], [address] Phone: [phone]   Fax:  [phone]  Patient Details  Name: Travis [name] MRN: [id number] Date of Birth: 1971/04/05 Referring Provider:  No ref. provider found  Encounter Date: 05/12/2016  PHYSICAL THERAPY DISCHARGE SUMMARY  Visits from Start of Care: 6  Current functional level related to goals / functional outcomes:     PT Short Term Goals - 04/22/16 1112      PT SHORT TERM GOAL #1   Title Pt will be IND in HEP to improve balance, coordination, reduce dizzienss, and improve/maintain strength. TARGET DATE FOR ALL STGS: 04/15/16   Baseline 04/22/16: met with handouts   Status Achieved     PT SHORT TERM GOAL #2   Title Pt will improve DGI score to >/=14/24 to decr. falls risk.   Baseline 04/22/16:  14/24   Status Achieved     PT SHORT TERM GOAL #3   Title Pt will amb. 300' over even terrain with LRAD at MOD I level to improve functional mobility.    Baseline 04/22/16: met distance with close supervision/min guard assist due to significantly fast gait speed (cues to decrease speed for safety provided throughout)   Status Partially Met     PT SHORT TERM GOAL #4   Title Pt will perform STS x10 reps without use of UE support to improve functional mobility.    Baseline 04/22/16: met with significant posterior pressure/LE push off of chair   Status Achieved     PT SHORT TERM GOAL #5   Title Pt will traverse 12 steps, in step through pattern, at MOD I level to safely traverse steps at home.   Baseline 10/20/017: with use of rails pt met goal. still with notable unsteadiness.   Status Achieved         PT Long Term Goals - 03/18/16 1554      PT LONG TERM GOAL #1   Title Pt will amb. 600' over even/uneven terrain with LRAD in order to improve functional mobility. TARGET DATE FOR ALL LTGS: 05/13/16   Status New     PT LONG TERM GOAL #2   Title Pt will  perform 180 degree turns and head turns/nods with dizziness not incr. > 2 points in order to improve safety during functional mobility.    Status New     PT LONG TERM GOAL #3   Title Pt will improve DGI score to >/=16/24 to decr. falls risk.    Status New     PT LONG TERM GOAL #4   Title Pt will traverse ramps (incline/decline) with LRAD at MOD I level to safely amb. in the community.   Status New     PT LONG TERM GOAL #5   Title Pt will verbalize understanding of fall prevention techniques to reduce risk of falls.    Status New     Additional Long Term Goals   Additional Long Term Goals Yes     PT LONG TERM GOAL #6   Title Pt will improve ABC score from 26.3% to 41% to improve confidence in balance during functional activities.   Status New        Remaining deficits: Unknown, as pt cancelled remaining visits and transferred to a PT clinic closer to home.    Education / Equipment: HEP  Plan: Patient agrees to discharge.  Patient goals were not met. Patient is being discharged due to not returning since the last  visit.  ?????       Melanie Pellot L 05/12/2016, 2:22 PM  [facility] [address] [place], [place], [address] Phone: [phone]   Fax:  [phone]   Geoffry Paradise, PT,DPT 05/12/16 2:22 PM Phone: [phone] Fax: [phone]

## 2016-05-13 ENCOUNTER — Ambulatory Visit (INDEPENDENT_AMBULATORY_CARE_PROVIDER_SITE_OTHER): Payer: 59 | Admitting: Behavioral Health

## 2016-05-13 ENCOUNTER — Other Ambulatory Visit (INDEPENDENT_AMBULATORY_CARE_PROVIDER_SITE_OTHER): Payer: 59

## 2016-05-13 DIAGNOSIS — Z23 Encounter for immunization: Secondary | ICD-10-CM | POA: Diagnosis not present

## 2016-05-13 DIAGNOSIS — E610 Copper deficiency: Secondary | ICD-10-CM | POA: Diagnosis not present

## 2016-05-13 NOTE — Progress Notes (Signed)
Pre visit review using our clinic review tool, if applicable. No additional management support is needed unless otherwise documented below in the visit note.  Patient came in clinic today for Tdap & Influenza vaccination. IM's given in both the Right & Left Deltoid. Patient tolerated injections well.

## 2016-05-15 LAB — COPPER, SERUM: Copper: 57 ug/dL — ABNORMAL LOW (ref 70–175)

## 2016-05-16 LAB — CERULOPLASMIN: CERULOPLASMIN: 17 mg/dL — AB (ref 18–36)

## 2016-05-17 ENCOUNTER — Ambulatory Visit: Payer: 59

## 2016-05-17 ENCOUNTER — Encounter: Payer: 59 | Admitting: Occupational Therapy

## 2016-05-19 ENCOUNTER — Encounter: Payer: Self-pay | Admitting: Occupational Therapy

## 2016-05-19 ENCOUNTER — Encounter: Payer: 59 | Admitting: Occupational Therapy

## 2016-05-19 ENCOUNTER — Ambulatory Visit: Payer: 59

## 2016-05-19 NOTE — Therapy (Signed)
[facility] [address] [place], [place], [address] Phone: [phone]   Fax:  [phone]  Patient Details  Name: Travis [name] MRN: [id number] Date of Birth: 1970/08/06 Referring Provider:  No ref. provider found  Encounter Date: 05/19/2016      OT Short Term Goals - 04/06/16 0901      OT SHORT TERM GOAL #1   Title I with HEP- 04/29/16   Time 4   Period Weeks   Status Met for inital     OT SHORT TERM GOAL #2   Title Pt will improve RUE standing functional reach to 10 inches or greater improved function.   Baseline RUE 8 inches, LUE 10 inches   Time 4   Period Weeks   Status Not met     OT SHORT TERM GOAL #3   Title pt will verbalize understanding of adapted strategies for ADLS/IADLS including AE/DME prn.   Time 4   Period Weeks   Status Not met, ongoing         OT Long Term Goals - 04/06/16 0909      OT LONG TERM GOAL #1   Title Pt will demonstrate improved fine motor coordination as evidenced by decreasing bilateral 9 hole peg test score by 3 secs. 05/28/16   Time 8   Period Weeks   Status Not met     OT LONG TERM GOAL #2   Title Pt will improve typing speed by 5 WPM for increased ease with work activities.   Time 8   Period Weeks   Status Not met     OT LONG TERM GOAL #3   Title Pt demonstrate improved bilateral functional use as evidenced by improving bilateral box and blocks score by 4 blocks    Time 8   Period Weeks   Status Not met         OCCUPATIONAL THERAPY DISCHARGE SUMMARY  Visits from Start of Care: 3  Current functional level related to goals / functional outcomes: Not assessed as pt cx remaining appts.   Remaining deficits: Weakness, decreased coordination, ataxia, decreased balance   Education / Equipment: Pt was educated in initial HEP and beginning adapted strategies for ADLS. Education was not completed as pt cx remaining appts. He is transferring care to a site  closer to his home.  Plan: Patient agrees to discharge.  Patient goals were not met. Patient is being discharged due to the patient's request.  ?????     RINE,KATHRYN 05/19/2016, 9:19 AM  [facility] [address] [place], [place], [address] Phone: [phone]   Fax:  [phone]

## 2016-05-30 NOTE — Addendum Note (Signed)
Addended by: Claudell KyleURNER, Joyia Riehle E on: 05/30/2016 04:40 PM   Modules accepted: Orders

## 2017-02-28 ENCOUNTER — Encounter: Payer: Self-pay | Admitting: Physician Assistant

## 2017-03-22 ENCOUNTER — Encounter: Payer: Self-pay | Admitting: Physician Assistant

## 2017-03-22 ENCOUNTER — Other Ambulatory Visit: Payer: Self-pay | Admitting: Physician Assistant

## 2017-07-26 NOTE — Therapy (Signed)
[facility] [address] [place], [place], [address] Phone: [phone]   Fax:  [phone]  Patient Details  Name: Travis [name] MRN: [id number] Date of Birth: Sep 14, 1970 Referring Provider:  No ref. provider found  Encounter Date: 07/26/2017  SPEECH THERAPY DISCHARGE SUMMARY  Pt was seen for 3 ST visits.  Current functional level related to goals / functional outcomes: Little progress was made in 3 sessions. Pt did not return to ST. Goals on pt's last ST session on 04-27-16 were as follows: SLP Short Term Goals - 04/27/16 0567              SLP SHORT TERM GOAL #1    Title Pt demonstrate intelligibility at the conversational level at mod I with use of compensatory strategies over three visits    Baseline 04-15-16    Time 3    Period Weeks    Status Revised         SLP SHORT TERM GOAL #2    Title Pt to demonstrate diaphragmatic breathing at mod I for improved breath support.     Time 3    Period Weeks    Status On-going         SLP SHORT TERM GOAL #3    Title Pt to report implementation of mindful voice/speech use (i.e. rest breaks, whistle for attention, etc) during his daily routine to maximize verbal endurance.    Time 3    Period Weeks    Status On-going                   SLP Long Term Goals - 04/27/16 1100              SLP LONG TERM GOAL #1    Title Pt to verbalize improved confidence in ability to manage speech via compensatory strategies and other therapeutic recommendations.     Time 7    Period Weeks    Status On-going         SLP LONG TERM GOAL #2    Title pt will demo 90% intelligiblity in 10 minutes mod complex conversation over two sessions with modified independence    Time 7    Period Weeks    Status New     Remaining deficits: Assumed all deficits remain.    Education / Equipment: Intelligibility strategies.  Plan: Patient agrees to discharge.  Patient goals were not met.  Patient is being discharged due to not returning since the last visit.  ?????       [place] ,MS, CCC-SLP  07/26/2017, 10:40 AM  [facility] [address] [place], [place], [address] Phone: [phone]   Fax:  [phone]

## 2017-12-14 ENCOUNTER — Encounter: Payer: Self-pay | Admitting: Emergency Medicine

## 2023-09-02 DEATH — deceased
